# Patient Record
Sex: Female | Born: 1990 | Race: Black or African American | Hispanic: No | Marital: Single | Smoking: Never smoker
Health system: Southern US, Community
[De-identification: ages and names within clinical notes are randomized; demographics above are authoritative.]

## PROBLEM LIST (undated history)

## (undated) DIAGNOSIS — R110 Nausea: Secondary | ICD-10-CM

## (undated) DIAGNOSIS — B20 Human immunodeficiency virus [HIV] disease: Secondary | ICD-10-CM

## (undated) DIAGNOSIS — B181 Chronic viral hepatitis B without delta-agent: Secondary | ICD-10-CM

## (undated) HISTORY — DX: Chronic viral hepatitis B without delta-agent: B18.1

## (undated) HISTORY — PX: NO PAST SURGERIES: SHX2092

## (undated) HISTORY — DX: Nausea: R11.0

## (undated) HISTORY — DX: Human immunodeficiency virus (HIV) disease: B20

---

## 2014-11-06 ENCOUNTER — Other Ambulatory Visit: Payer: Managed Care, Other (non HMO)

## 2014-11-06 DIAGNOSIS — B20 Human immunodeficiency virus [HIV] disease: Secondary | ICD-10-CM

## 2014-11-06 DIAGNOSIS — Z79899 Other long term (current) drug therapy: Secondary | ICD-10-CM

## 2014-11-06 DIAGNOSIS — Z113 Encounter for screening for infections with a predominantly sexual mode of transmission: Secondary | ICD-10-CM

## 2014-11-06 LAB — COMPLETE METABOLIC PANEL WITH GFR
ALT: 87 U/L — AB (ref 6–29)
AST: 91 U/L — ABNORMAL HIGH (ref 10–30)
Albumin: 3 g/dL — ABNORMAL LOW (ref 3.6–5.1)
Alkaline Phosphatase: 52 U/L (ref 33–115)
BUN: 6 mg/dL — ABNORMAL LOW (ref 7–25)
CHLORIDE: 107 mmol/L (ref 98–110)
CO2: 24 mmol/L (ref 20–31)
CREATININE: 0.65 mg/dL (ref 0.50–1.10)
Calcium: 8.3 mg/dL — ABNORMAL LOW (ref 8.6–10.2)
GFR, Est Non African American: >89 mL/min (ref >=60)
Glucose, Bld: 86 mg/dL (ref 65–99)
POTASSIUM: 3.5 mmol/L (ref 3.5–5.3)
Sodium: 138 mmol/L (ref 135–146)
Total Bilirubin: 0.7 mg/dL (ref 0.2–1.2)
Total Protein: 7.5 g/dL (ref 6.1–8.1)

## 2014-11-06 LAB — CBC WITH DIFFERENTIAL/PLATELET
BASOS ABS: 0 10*3/uL (ref 0.0–0.1)
Basophils Relative: 0 % (ref 0–1)
EOS ABS: 0.1 10*3/uL (ref 0.0–0.7)
EOS PCT: 2 % (ref 0–5)
HCT: 36 % (ref 36.0–46.0)
Hemoglobin: 12.3 g/dL (ref 12.0–15.0)
LYMPHS ABS: 2.1 10*3/uL (ref 0.7–4.0)
Lymphocytes Relative: 47 % — ABNORMAL HIGH (ref 12–46)
MCH: 31.8 pg (ref 26.0–34.0)
MCHC: 34.2 g/dL (ref 30.0–36.0)
MCV: 93 fL (ref 78.0–100.0)
MONO ABS: 0.4 10*3/uL (ref 0.1–1.0)
MPV: 9.4 fL (ref 8.6–12.4)
Monocytes Relative: 9 % (ref 3–12)
Neutro Abs: 1.9 10*3/uL (ref 1.7–7.7)
Neutrophils Relative %: 42 % — ABNORMAL LOW (ref 43–77)
PLATELETS: 226 10*3/uL (ref 150–400)
RBC: 3.87 MIL/uL (ref 3.87–5.11)
RDW: 14 % (ref 11.5–15.5)
WBC: 4.5 10*3/uL (ref 4.0–10.5)

## 2014-11-06 LAB — LIPID PANEL
Cholesterol: 152 mg/dL (ref 125–200)
HDL: 56 mg/dL (ref >=46)
LDL CALC: 80 mg/dL (ref <130)
Total CHOL/HDL Ratio: 2.7 Ratio (ref <=5.0)
Triglycerides: 80 mg/dL (ref <150)
VLDL: 16 mg/dL (ref <30)

## 2014-11-07 LAB — HIV-1 RNA ULTRAQUANT REFLEX TO GENTYP+
HIV 1 RNA QUANT: 2193 {copies}/mL — AB (ref <20)
HIV-1 RNA Quant, Log: 3.34 Log copies/mL — ABNORMAL HIGH (ref <1.30)

## 2014-11-07 LAB — URINALYSIS
Bilirubin Urine: NEGATIVE
Glucose, UA: NEGATIVE
HGB URINE DIPSTICK: NEGATIVE
KETONES UR: NEGATIVE
LEUKOCYTES UA: NEGATIVE
NITRITE: NEGATIVE
PH: 6 (ref 5.0–8.0)
PROTEIN: NEGATIVE
Specific Gravity, Urine: 1.024 (ref 1.001–1.035)

## 2014-11-07 LAB — HEPATITIS B SURF AG CONFIRMATION: Hepatitis B Surf Ag Confirmation: POSITIVE — AB

## 2014-11-07 LAB — URINE CYTOLOGY ANCILLARY ONLY
Chlamydia: NEGATIVE
Neisseria Gonorrhea: NEGATIVE

## 2014-11-07 LAB — T-HELPER CELLS (CD4) COUNT (NOT AT ARMC)
CD4 % Helper T Cell: 31 % — ABNORMAL LOW (ref 33–55)
CD4 T CELL ABS: 660 /uL (ref 400–2700)

## 2014-11-07 LAB — HEPATITIS C ANTIBODY: HCV AB: NEGATIVE

## 2014-11-07 LAB — HEPATITIS B SURFACE ANTIBODY,QUALITATIVE: HEP B S AB: NEGATIVE

## 2014-11-07 LAB — RPR

## 2014-11-07 LAB — HEPATITIS B SURFACE ANTIGEN: HEP B S AG: POSITIVE — AB

## 2014-11-07 LAB — HEPATITIS B CORE ANTIBODY, TOTAL: HEP B C TOTAL AB: REACTIVE — AB

## 2014-11-07 LAB — HEPATITIS A ANTIBODY, TOTAL: HEP A TOTAL AB: REACTIVE — AB

## 2014-11-08 LAB — QUANTIFERON TB GOLD ASSAY (BLOOD)
Interferon Gamma Release Assay: NEGATIVE
Mitogen value: 1.02 [IU]/mL
Quantiferon Nil Value: 0.06 [IU]/mL
Quantiferon Tb Ag Minus Nil Value: -0.01 [IU]/mL
TB Ag value: 0.05 [IU]/mL

## 2014-11-12 LAB — HLA B*5701: HLA-B 5701 W/RFLX HLA-B HIGH: NEGATIVE

## 2014-11-17 LAB — HIV-1 GENOTYPR PLUS

## 2014-11-20 ENCOUNTER — Encounter: Payer: Self-pay | Admitting: *Deleted

## 2014-11-20 ENCOUNTER — Ambulatory Visit (INDEPENDENT_AMBULATORY_CARE_PROVIDER_SITE_OTHER): Payer: Managed Care, Other (non HMO) | Admitting: Internal Medicine

## 2014-11-20 ENCOUNTER — Encounter: Payer: Self-pay | Admitting: Internal Medicine

## 2014-11-20 VITALS — Temp 98.6°F | Ht 61.0 in | Wt 128.5 lb

## 2014-11-20 DIAGNOSIS — B20 Human immunodeficiency virus [HIV] disease: Secondary | ICD-10-CM

## 2014-11-20 DIAGNOSIS — R110 Nausea: Secondary | ICD-10-CM

## 2014-11-20 DIAGNOSIS — B181 Chronic viral hepatitis B without delta-agent: Secondary | ICD-10-CM | POA: Diagnosis not present

## 2014-11-20 DIAGNOSIS — Z23 Encounter for immunization: Secondary | ICD-10-CM

## 2014-11-20 MED ORDER — EMTRICITABINE-TENOFOVIR AF 200-25 MG PO TABS: 1.0000 | ORAL_TABLET | Freq: Every day | ORAL | Status: DC

## 2014-11-20 MED ORDER — DOLUTEGRAVIR SODIUM 50 MG PO TABS: 50.0000 mg | ORAL_TABLET | Freq: Every day | ORAL | Status: DC

## 2014-11-20 MED ORDER — ONDANSETRON HCL 4 MG PO TABS: 4.0000 mg | ORAL_TABLET | Freq: Three times a day (TID) | ORAL | Status: DC | PRN

## 2014-11-20 NOTE — Progress Notes (Signed)
Patient ID: Rolland BimlerAmber Rivers, female   DOB: 01-Jan-1991, 24 y.o.   MRN: [id number] HPI: Sarah Rivers is a 24 y.o. female who is here for her initial visit after being tx here.   Allergies: No Known Allergies  Vitals: Temp: 98.6 F (37 C) (11/10 1008) Temp Source: Oral (11/10 1008)  Past Medical History: No past medical history on file.  Social History: Social History   Social History  . Marital Status: Single    Spouse Name: N/A  . Number of Children: N/A  . Years of Education: N/A   Social History Main Topics  . Smoking status: Never Smoker   . Smokeless tobacco: Never Used  . Alcohol Use: No  . Drug Use: No  . Sexual Activity: No   Other Topics Concern  . None   Social History Narrative  . None    Previous Regimen: ATV/r/TRV, Complera  Current Regimen: Stribild  Labs: HIV 1 RNA QUANT (copies/mL)  Date Value  11/06/2014 2193*   CD4 T CELL ABS (/uL)  Date Value  11/06/2014 660   HEP B S AB (no units)  Date Value  11/06/2014 NEG   HEPATITIS B SURFACE AG (no units)  Date Value  11/06/2014 POSITIVE*   HCV AB (no units)  Date Value  11/06/2014 NEGATIVE    CrCl: Estimated Creatinine Clearance: 89.8 mL/min (by C-G formula based on Cr of 0.65).  Lipids:    Component Value Date/Time   CHOL 152 11/06/2014 1149   TRIG 80 11/06/2014 1149   HDL 56 11/06/2014 1149   CHOLHDL 2.7 11/06/2014 1149   VLDL 16 11/06/2014 1149   LDLCALC 80 11/06/2014 1149    Assessment: 24 yo who is here for the first time after being transferred for her HIV care here. She has been on several regimen in the past and they were changed due to mostly ADE like nausea. Her most current one is Stribild and she is facing the same thing. We are going to change her to DTG + Descovy since it probably has the lowest profile for ADEs. Going them over with her extensively and she is more than willing to go with this regimen. Kathie RhodesBetty sat her down and process the prescriptions [facility]. Her copay is $10 for Descovy. She is going to pick it up tomorrow.  Recommendations:  Dc Stribild Start Tivicay 50mg  PO qday Start Descovy 1 PO qday Start Zofran 4mg  PO q8prn  Clide CliffPham, Minh Quang, PharmD Clinical Infectious Disease Pharmacist [facility] 11/20/2014, 11:47 AM

## 2014-11-20 NOTE — Progress Notes (Signed)
Patient ID: Rolland BimlerAmber Vien, female   DOB: [birth year], 24 y.o.   MRN: [id number]       Patient ID: Joice LoftsAmber Schelling, female   DOB: [birth year], 24 y.o.   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 Her birth mother is deceased, she was adopted by her aunt/uncle. She was [facility] for the better part of her life at [facility], Dr. Tobias AlexanderWitley Williams.  Then in the last year, she sought care from another provider, in June 2015- he placed her on  stribild which she took up until this Sept.  Intermittently off/on of treatment this year. Last took ART since Sept. No meds in the last 2 months. Took stribild which made her have nausea, weakness, diarrhea. Even with taking with food and splitting   ART HX = Previously on  truvada-ATVr -> complera ( still some side effects but not as bad as stribild) -> then switched to stribid.   Originally [place], [place] [employer]. 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 Has [place] for 4-5 months, since June. Working at call center - 40hrs/ wk.  Soc hx: aunt and uncle adopted her at birth, live in [place]  No Known Allergies No current outpatient prescriptions on file prior to visit.   No current facility-administered medications on file prior to visit.   Active Ambulatory Problems    Diagnosis Date Noted  . HIV disease (HCC) 11/21/2014  . Chronic hepatitis B (HCC) 11/21/2014  . Chronic nausea 11/21/2014   Resolved Ambulatory Problems    Diagnosis Date Noted  . No Resolved Ambulatory Problems   No Additional Past Medical History   [facility]   Health Maintenance Due  Topic Date Due  . TETANUS/TDAP  05/04/2010  . PAP SMEAR  05/03/2012  . INFLUENZA VACCINE  08/11/2014    Social History  Substance Use Topics  . Smoking status: Never Smoker   . Smokeless tobacco: Never Used  . Alcohol Use: No    - not sexual active, started at 24yo.  Family Hx: DM2, and HTN  Review of Systems Review of Systems  Constitutional: Negative for fever, chills, diaphoresis, activity change, appetite change, fatigue and unexpected weight change.  HENT: Negative for congestion, sore throat, rhinorrhea, sneezing, trouble swallowing and sinus pressure.  Eyes: Negative for photophobia and visual disturbance.  Respiratory: Negative for cough, chest tightness, shortness of breath, wheezing and stridor.  Cardiovascular: Negative for chest pain, palpitations and leg swelling.  Gastrointestinal: Negative for nausea, vomiting, abdominal pain, diarrhea, constipation, blood in stool, abdominal distention and anal bleeding.  Genitourinary: Negative for dysuria, hematuria, flank pain and difficulty urinating.  Musculoskeletal: Negative for myalgias, back pain, joint swelling, arthralgias and gait problem.  Skin: Negative for color change, pallor, rash and wound.  Neurological: Negative for dizziness, tremors, weakness and light-headedness.  Hematological: Negative for adenopathy. Does not bruise/bleed easily.  Psychiatric/Behavioral: Negative for behavioral problems, confusion, sleep disturbance, dysphoric mood, decreased concentration and agitation.    Physical Exam   Temp(Src) 98.6 F (37 C) (Oral)  Ht 5\' 1"  (1.549 m)  Wt 128 lb 8 oz (58.287 kg)  BMI 24.29 kg/m2  LMP 11/03/2014  Physical Exam  Constitutional:  oriented to person, place, and time. appears well-developed and well-nourished. No distress.  HENT: [place]/AT, PERRLA, no scleral icterus Mouth/Throat: Oropharynx is clear and moist. No oropharyngeal exudate.  Cardiovascular: Normal rate, regular rhythm and normal heart sounds. Exam reveals no gallop and no friction rub.  No murmur heard.  Pulmonary/Chest: Effort normal and breath sounds normal. No respiratory distress.  has no wheezes.  Neck = supple, no nuchal rigidity Abdominal: Soft. Bowel sounds are  normal.  exhibits no distension. There is no tenderness.  Lymphadenopathy: no cervical adenopathy. No axillary adenopathy Neurological: alert and oriented to person, place, and time.  Skin: Skin is warm and dry. No rash noted. No erythema.  Psychiatric: a normal mood and affect.  behavior is normal.    Lab Results  Component Value Date   CD4TCELL 31* 11/06/2014   Lab Results  Component Value Date   CD4TABS 660 11/06/2014   Lab Results  Component Value Date   HIV1RNAQUANT 2193* 11/06/2014   Lab Results  Component Value Date   HEPBSAB NEG 11/06/2014   No results found for: RPR  CBC Lab Results  Component Value Date   WBC 4.5 11/06/2014   RBC 3.87 11/06/2014   HGB 12.3 11/06/2014   HCT 36.0 11/06/2014   PLT 226 11/06/2014   MCV 93.0 11/06/2014   MCH 31.8 11/06/2014   MCHC 34.2 11/06/2014   RDW 14.0 11/06/2014   LYMPHSABS 2.1 11/06/2014   MONOABS 0.4 11/06/2014   EOSABS 0.1 11/06/2014   BASOSABS 0.0 11/06/2014   BMET Lab Results  Component Value Date   NA 138 11/06/2014   K 3.5 11/06/2014   CL 107 11/06/2014   CO2 24 11/06/2014   GLUCOSE 86 11/06/2014   BUN 6* 11/06/2014   CREATININE 0.65 11/06/2014   CALCIUM 8.3* 11/06/2014   GFRNONAA >89 11/06/2014   GFRAA >89 11/06/2014     Assessment and Plan  hiv disease = will start her on tivicay and descovy. She will like the pill size being small. We will check that she does not have integrase resistance  Chronic hep b = will check hep B viral load, descovy will provide adequate tx for HBV  Health maintenance = get pap arranged through the clinic since it has been 2 years since last exam. She defered further immunizations  Chronic nausea = will give prn zofran.

## 2014-11-21 ENCOUNTER — Encounter: Payer: Self-pay | Admitting: Internal Medicine

## 2014-11-21 DIAGNOSIS — B20 Human immunodeficiency virus [HIV] disease: Secondary | ICD-10-CM | POA: Insufficient documentation

## 2014-11-21 DIAGNOSIS — R110 Nausea: Secondary | ICD-10-CM | POA: Insufficient documentation

## 2014-11-21 DIAGNOSIS — B181 Chronic viral hepatitis B without delta-agent: Secondary | ICD-10-CM | POA: Insufficient documentation

## 2014-11-25 LAB — HIV-1 RNA QUANT-NO REFLEX-BLD
HIV 1 RNA Quant: 458 copies/mL — ABNORMAL HIGH (ref <20)
HIV-1 RNA QUANT, LOG: 2.66 {Log_copies}/mL — AB (ref <1.30)

## 2014-11-27 LAB — HEPATITIS B DNA, ULTRAQUANTITATIVE, PCR

## 2014-11-28 LAB — HIV-1 INTEGRASE GENOTYPE

## 2015-01-13 ENCOUNTER — Ambulatory Visit: Payer: Managed Care, Other (non HMO) | Admitting: Internal Medicine

## 2015-01-23 ENCOUNTER — Telehealth: Payer: Self-pay | Admitting: Pharmacy Technician

## 2015-01-28 MED FILL — TIVICAY 50 MG TABLET: 50 | 30 days supply | Qty: 30 | Fill #1

## 2015-01-28 MED FILL — *DESCOVY 200-25 MG TABLET: 200-25 | 30 days supply | Qty: 30 | Fill #1

## 2015-02-12 ENCOUNTER — Encounter (HOSPITAL_COMMUNITY): Payer: Self-pay | Admitting: *Deleted

## 2015-02-12 ENCOUNTER — Inpatient Hospital Stay (HOSPITAL_COMMUNITY)
Admission: AD | Admit: 2015-02-12 | Discharge: 2015-02-12 | Disposition: A | Discharge status: Home / Self Care | Payer: Managed Care, Other (non HMO) | Source: Ambulatory Visit | Attending: Obstetrics & Gynecology | Admitting: Obstetrics & Gynecology

## 2015-02-12 DIAGNOSIS — R109 Unspecified abdominal pain: Secondary | ICD-10-CM | POA: Diagnosis not present

## 2015-02-12 DIAGNOSIS — N939 Abnormal uterine and vaginal bleeding, unspecified: Secondary | ICD-10-CM | POA: Diagnosis present

## 2015-02-12 DIAGNOSIS — N390 Urinary tract infection, site not specified: Secondary | ICD-10-CM

## 2015-02-12 DIAGNOSIS — B20 Human immunodeficiency virus [HIV] disease: Secondary | ICD-10-CM | POA: Insufficient documentation

## 2015-02-12 LAB — URINALYSIS, ROUTINE W REFLEX MICROSCOPIC
Bilirubin Urine: NEGATIVE
GLUCOSE, UA: NEGATIVE mg/dL
Hgb urine dipstick: NEGATIVE
KETONES UR: 15 mg/dL — AB
Nitrite: NEGATIVE
PROTEIN: NEGATIVE mg/dL
Specific Gravity, Urine: 1.025 (ref 1.005–1.030)
pH: 6 (ref 5.0–8.0)

## 2015-02-12 LAB — URINE MICROSCOPIC-ADD ON: RBC / HPF: NONE SEEN RBC/hpf (ref 0–5)

## 2015-02-12 LAB — POCT PREGNANCY, URINE: Preg Test, Ur: NEGATIVE

## 2015-02-12 MED ORDER — SULFAMETHOXAZOLE-TRIMETHOPRIM 800-160 MG PO TABS: 1.0000 | ORAL_TABLET | Freq: Two times a day (BID) | ORAL | Status: DC

## 2015-02-12 MED FILL — SULFAMETHOXAZOLE/TMP DS TAB: 800-160 | 3 days supply | Qty: 6 | Fill #0

## 2015-02-12 NOTE — MAU Note (Signed)
Pt states she came in today because she started her cycle on 01/22 and the bleeding is just now stopping. Also reports a lot of cramping.

## 2015-02-12 NOTE — MAU Note (Addendum)
Period started 22nd of January and lasted til yesterday with severe cramping; usually pt's period last 3-4 days with light cramping; is not currently on [facility];

## 2015-02-12 NOTE — Discharge Instructions (Signed)

## 2015-02-12 NOTE — MAU Provider Note (Signed)
History     CSN: [id number]  Arrival date and time: 02/12/15 7829   First Provider Initiated Contact with Patient 02/12/15 0840      Chief Complaint  Patient presents with  . Vaginal Bleeding   HPI   Ms.Sarah Rivers is a 25 y.o. female G1P0010 with a history of HIV and Hep B presents to MAU with concerns regarding her last menstrual cycle. She normally has regular periods that are not painful. This last period started on 12/22 and ended on 2/1- this was the normal time for her period. She feels that this cycle was "weird".  The flow was different and changed throughout the cycle; altering from heavy to light. She also had more pain with this cycle than normal. She continues to have pain in both sids of her abdomen. The pain is cramp like and she currently rates the pain 7/10. She has not taken anything for the pain.  She was eager to know if her pregnancy test was negative.   She is currently sexually active. She always uses a condom with intercourse.  She declines a pelvic exam and STI testing. Says she would like to wait to do this when she see's her GYN for annual.   OB History    Gravida Para Term Preterm AB TAB SAB Ectopic Multiple Living   Past Medical History  Diagnosis Date  . HIV disease (HCC)   . Chronic hepatitis B (HCC)   . Chronic nausea     Past Surgical History  Procedure Laterality Date  . No past surgeries      Family History  Problem Relation Age of Onset  . Hypertension Mother   . Diabetes Mother     Social History  Substance Use Topics  . Smoking status: Never Smoker   . Smokeless tobacco: Never Used  . Alcohol Use: No    Allergies: No Known Allergies  Prescriptions prior to admission  Medication Sig Dispense Refill Last Dose  . dolutegravir (TIVICAY) 50 MG tablet Take 1 tablet (50 mg total) by mouth daily. 30 tablet 11 02/11/2015 at Unknown time  . emtricitabine-tenofovir AF (DESCOVY) 200-25 MG tablet Take 1 tablet by  mouth daily. 30 tablet 11 02/11/2015 at Unknown time  . ondansetron (ZOFRAN) 4 MG tablet Take 1 tablet (4 mg total) by mouth every 8 (eight) hours as needed for nausea or vomiting. (Patient not taking: Reported on 02/12/2015) 30 tablet 11    Results for orders placed or performed during the hospital encounter of 02/12/15 (from the past 48 hour(s))  Urinalysis, Routine w reflex microscopic (not at [facility])     Status: Abnormal   Collection Time: 02/12/15  6:55 AM  Result Value Ref Range   Color, Urine YELLOW YELLOW   APPearance HAZY (A) CLEAR   Specific Gravity, Urine 1.025 1.005 - 1.030   pH 6.0 5.0 - 8.0   Glucose, UA NEGATIVE NEGATIVE mg/dL   Hgb urine dipstick NEGATIVE NEGATIVE   Bilirubin Urine NEGATIVE NEGATIVE   Ketones, ur 15 (A) NEGATIVE mg/dL   Protein, ur NEGATIVE NEGATIVE mg/dL   Nitrite NEGATIVE NEGATIVE   Leukocytes, UA MODERATE (A) NEGATIVE  Urine microscopic-add on     Status: Abnormal   Collection Time: 02/12/15  6:55 AM  Result Value Ref Range   Squamous Epithelial / LPF 0-5 (A) NONE SEEN   WBC, UA 6-30 0 - 5 WBC/hpf   RBC /  HPF NONE SEEN 0 - 5 RBC/hpf   Bacteria, UA FEW (A) NONE SEEN   Urine-Other MUCOUS PRESENT   Pregnancy, urine POC     Status: None   Collection Time: 02/12/15  7:45 AM  Result Value Ref Range   Preg Test, Ur NEGATIVE NEGATIVE    Comment:        THE SENSITIVITY OF THIS METHODOLOGY IS >24 mIU/mL     Review of Systems  Constitutional: Negative for fever and chills.  Gastrointestinal: Positive for abdominal pain.  Genitourinary: Positive for dysuria. Negative for urgency and frequency.   Physical Exam   Blood pressure 119/73, pulse 62, temperature 98.8 F (37.1 C), temperature source Oral, resp. rate 16, last menstrual period 02/01/2015, SpO2 100 %.  Physical Exam  Constitutional: She is oriented to person, place, and time. She appears well-developed and well-nourished. No distress.  HENT:  Head: Normocephalic.  Eyes: Pupils are equal,  round, and reactive to light.  Respiratory: Effort normal.  GI: Soft. Normal appearance. There is tenderness in the suprapubic area. There is no rigidity, no rebound, no guarding and no CVA tenderness.  Musculoskeletal: Normal range of motion.  Neurological: She is alert and oriented to person, place, and time.  Skin: Skin is warm. She is not diaphoretic.  Psychiatric: Her behavior is normal.    MAU Course  Procedures  None   MDM She declines the need for pain medication at this time.   Assessment and Plan    A:  1. Abdominal cramping   2. UTI (urinary tract infection), uncomplicated     P:  Discharge home in stable condition RX: Bactrim Follow up with GYN for follow up Return to MAU if symptoms worsen.     Duane Lope, NP 02/12/2015 2:46 PM

## 2015-02-14 ENCOUNTER — Emergency Department (HOSPITAL_COMMUNITY): Payer: Managed Care, Other (non HMO)

## 2015-02-14 ENCOUNTER — Emergency Department (HOSPITAL_COMMUNITY)
Admission: EM | Admit: 2015-02-14 | Discharge: 2015-02-14 | Disposition: A | Discharge status: Home / Self Care | Payer: Managed Care, Other (non HMO) | Attending: Emergency Medicine | Admitting: Emergency Medicine

## 2015-02-14 ENCOUNTER — Encounter (HOSPITAL_COMMUNITY): Payer: Self-pay | Admitting: Emergency Medicine

## 2015-02-14 DIAGNOSIS — R300 Dysuria: Secondary | ICD-10-CM

## 2015-02-14 DIAGNOSIS — R109 Unspecified abdominal pain: Secondary | ICD-10-CM

## 2015-02-14 DIAGNOSIS — N739 Female pelvic inflammatory disease, unspecified: Secondary | ICD-10-CM | POA: Diagnosis not present

## 2015-02-14 DIAGNOSIS — R1030 Lower abdominal pain, unspecified: Secondary | ICD-10-CM

## 2015-02-14 DIAGNOSIS — N921 Excessive and frequent menstruation with irregular cycle: Secondary | ICD-10-CM | POA: Insufficient documentation

## 2015-02-14 DIAGNOSIS — R112 Nausea with vomiting, unspecified: Secondary | ICD-10-CM | POA: Diagnosis not present

## 2015-02-14 DIAGNOSIS — B20 Human immunodeficiency virus [HIV] disease: Secondary | ICD-10-CM | POA: Insufficient documentation

## 2015-02-14 DIAGNOSIS — N730 Acute parametritis and pelvic cellulitis: Secondary | ICD-10-CM

## 2015-02-14 DIAGNOSIS — Z79899 Other long term (current) drug therapy: Secondary | ICD-10-CM | POA: Insufficient documentation

## 2015-02-14 DIAGNOSIS — N760 Acute vaginitis: Secondary | ICD-10-CM | POA: Diagnosis not present

## 2015-02-14 DIAGNOSIS — R740 Nonspecific elevation of levels of transaminase and lactic acid dehydrogenase [LDH]: Secondary | ICD-10-CM | POA: Diagnosis not present

## 2015-02-14 DIAGNOSIS — R102 Pelvic and perineal pain: Secondary | ICD-10-CM

## 2015-02-14 DIAGNOSIS — N898 Other specified noninflammatory disorders of vagina: Secondary | ICD-10-CM

## 2015-02-14 DIAGNOSIS — Z792 Long term (current) use of antibiotics: Secondary | ICD-10-CM | POA: Diagnosis not present

## 2015-02-14 DIAGNOSIS — N949 Unspecified condition associated with female genital organs and menstrual cycle: Secondary | ICD-10-CM

## 2015-02-14 DIAGNOSIS — R7401 Elevation of levels of liver transaminase levels: Secondary | ICD-10-CM

## 2015-02-14 DIAGNOSIS — B9689 Other specified bacterial agents as the cause of diseases classified elsewhere: Secondary | ICD-10-CM

## 2015-02-14 LAB — URINALYSIS, ROUTINE W REFLEX MICROSCOPIC
BILIRUBIN URINE: NEGATIVE
GLUCOSE, UA: NEGATIVE mg/dL
Hgb urine dipstick: NEGATIVE
KETONES UR: NEGATIVE mg/dL
NITRITE: NEGATIVE
PH: 6 (ref 5.0–8.0)
Protein, ur: NEGATIVE mg/dL
Specific Gravity, Urine: 1.007 (ref 1.005–1.030)

## 2015-02-14 LAB — WET PREP, GENITAL
Sperm: NONE SEEN
Trich, Wet Prep: NONE SEEN
Yeast Wet Prep HPF POC: NONE SEEN

## 2015-02-14 LAB — DIFFERENTIAL
BASOS ABS: 0 10*3/uL (ref 0.0–0.1)
Basophils Relative: 0 %
Eosinophils Absolute: 0.1 10*3/uL (ref 0.0–0.7)
Eosinophils Relative: 1 %
LYMPHS ABS: 2.4 10*3/uL (ref 0.7–4.0)
LYMPHS PCT: 29 %
Monocytes Absolute: 0.6 10*3/uL (ref 0.1–1.0)
Monocytes Relative: 7 %
NEUTROS PCT: 63 %
Neutro Abs: 5.1 10*3/uL (ref 1.7–7.7)

## 2015-02-14 LAB — COMPREHENSIVE METABOLIC PANEL
ALBUMIN: 3.3 g/dL — AB (ref 3.5–5.0)
ALK PHOS: 57 U/L (ref 38–126)
ALT: 69 U/L — AB (ref 14–54)
AST: 97 U/L — AB (ref 15–41)
Anion gap: 11 (ref 5–15)
BILIRUBIN TOTAL: 1 mg/dL (ref 0.3–1.2)
BUN: 7 mg/dL (ref 6–20)
CO2: 21 mmol/L — ABNORMAL LOW (ref 22–32)
CREATININE: 0.7 mg/dL (ref 0.44–1.00)
Calcium: 9.2 mg/dL (ref 8.9–10.3)
Chloride: 107 mmol/L (ref 101–111)
GFR calc Af Amer: >60 mL/min (ref >60)
GFR calc non Af Amer: >60 mL/min (ref >60)
GLUCOSE: 82 mg/dL (ref 65–99)
POTASSIUM: 4.4 mmol/L (ref 3.5–5.1)
Sodium: 139 mmol/L (ref 135–145)
TOTAL PROTEIN: 8 g/dL (ref 6.5–8.1)

## 2015-02-14 LAB — CBC
HEMATOCRIT: 36.8 % (ref 36.0–46.0)
Hemoglobin: 12.5 g/dL (ref 12.0–15.0)
MCH: 31.8 pg (ref 26.0–34.0)
MCHC: 34 g/dL (ref 30.0–36.0)
MCV: 93.6 fL (ref 78.0–100.0)
PLATELETS: 202 10*3/uL (ref 150–400)
RBC: 3.93 MIL/uL (ref 3.87–5.11)
RDW: 12.9 % (ref 11.5–15.5)
WBC: 8.2 10*3/uL (ref 4.0–10.5)

## 2015-02-14 LAB — URINE MICROSCOPIC-ADD ON
BACTERIA UA: NONE SEEN
RBC / HPF: NONE SEEN RBC/hpf (ref 0–5)

## 2015-02-14 LAB — LIPASE, BLOOD: Lipase: 46 U/L (ref 11–51)

## 2015-02-14 MED ORDER — CEFTRIAXONE SODIUM 250 MG IJ SOLR
250.0000 mg | Freq: Once | INTRAMUSCULAR | Status: AC
  Administered 2015-02-14: 250 mg via INTRAMUSCULAR
  Filled 2015-02-14: qty 250

## 2015-02-14 MED ORDER — ONDANSETRON 4 MG PO TBDP
8.0000 mg | ORAL_TABLET | Freq: Once | ORAL | Status: AC
  Administered 2015-02-14: 8 mg via ORAL
  Filled 2015-02-14: qty 2

## 2015-02-14 MED ORDER — AZITHROMYCIN 250 MG PO TABS
1000.0000 mg | ORAL_TABLET | Freq: Once | ORAL | Status: AC
  Administered 2015-02-14: 1000 mg via ORAL
  Filled 2015-02-14: qty 4

## 2015-02-14 MED ORDER — DOXYCYCLINE HYCLATE 100 MG PO CAPS: 100.0000 mg | ORAL_CAPSULE | Freq: Two times a day (BID) | ORAL | Status: DC

## 2015-02-14 MED ORDER — METRONIDAZOLE 500 MG PO TABS: 500.0000 mg | ORAL_TABLET | Freq: Two times a day (BID) | ORAL | Status: DC

## 2015-02-14 MED ORDER — LIDOCAINE HCL (PF) 1 % IJ SOLN: 1.0000 mL | Freq: Once | INTRAMUSCULAR | Status: DC

## 2015-02-14 MED ORDER — ONDANSETRON 4 MG PO TBDP: 4.0000 mg | ORAL_TABLET | Freq: Three times a day (TID) | ORAL | Status: DC | PRN

## 2015-02-14 MED ORDER — LIDOCAINE HCL (PF) 1 % IJ SOLN
INTRAMUSCULAR | Status: AC
  Administered 2015-02-14: 1 mL via INTRAMUSCULAR
  Filled 2015-02-14: qty 5

## 2015-02-14 NOTE — Discharge Instructions (Signed)
You have been treated for gonorrhea and chlamydia in the ER but the hospital will call you if lab is positive. You were tested for Syphilis, and the hospital will call you if the lab is positive. Do not engage in sexual intercourse until you find out about your results. Given your vaginal discharge and pelvic pain, you are being treated for pelvic inflammatory disease, take doxycycline as directed and avoid sun exposure while taking this medication. Your vaginal swab also showed bacterial vaginosis, take flagyl as directed, and avoid alcohol use while taking this medication.  Use tylenol or motrin as needed for abdominal pain. Continue taking your bactrim for your urinary tract infection. Use zofran as needed for nausea/vomiting. Stay well hydrated. Follow up with your regular doctor or OBGYN in 1 week for recheck of ongoing symptoms. Return to the ER for changes or worsening symptoms   Abdominal Pain, Adult Many things can cause belly (abdominal) pain. Most times, the belly pain is not dangerous. Many cases of belly pain can be watched and treated at home. HOME CARE   Do not take medicines that help you go poop (laxatives) unless told to by your doctor.  Only take medicine as told by your doctor.  Eat or drink as told by your doctor. Your doctor will tell you if you should be on a special diet. GET HELP IF:  You do not know what is causing your belly pain.  You have belly pain while you are sick to your stomach (nauseous) or have runny poop (diarrhea).  You have pain while you pee or poop.  Your belly pain wakes you up at night.  You have belly pain that gets worse or better when you eat.  You have belly pain that gets worse when you eat fatty foods.  You have a fever. GET HELP RIGHT AWAY IF:   The pain does not go away within 2 hours.  You keep throwing up (vomiting).  The pain changes and is only in the right or left part of the belly.  You have bloody or tarry looking  poop. MAKE SURE YOU:   Understand these instructions.  Will watch your condition.  Will get help right away if you are not doing well or get worse.   This information is not intended to replace advice given to you by your health care provider. Make sure you discuss any questions you have with your health care provider.   Document Released: 06/15/2007 Document Revised: 01/17/2014 Document Reviewed: 09/05/2012 Elsevier Interactive Patient Education 2016 [employer]  Bacterial Vaginosis Bacterial vaginosis is an infection of the vagina. It happens when too many germs (bacteria) grow in the vagina. Having this infection puts you at risk for getting other infections from sex. Treating this infection can help lower your risk for other infections, such as:   Chlamydia.  Gonorrhea.  HIV.  Herpes. HOME CARE  Take your medicine as told by your doctor.  Finish your medicine even if you start to feel better.  Tell your sex partner that you have an infection. They should see their doctor for treatment.  During treatment:  Avoid sex or use condoms correctly.  Do not douche.  Do not drink alcohol unless your doctor tells you it is ok.  Do not breastfeed unless your doctor tells you it is ok. GET HELP IF:  You are not getting better after 3 days of treatment.  You have more grey fluid (discharge) coming from your vagina than before.  You have more pain than before.  You have a fever. MAKE SURE YOU:   Understand these instructions.  Will watch your condition.  Will get help right away if you are not doing well or get worse.   This information is not intended to replace advice given to you by your health care provider. Make sure you discuss any questions you have with your health care provider.   Document Released: 10/06/2007 Document Revised: 01/17/2014 Document Reviewed: 08/08/2012 Elsevier Interactive Patient Education 2016 [employer]  Pelvic Inflammatory  Disease Pelvic inflammatory disease (PID) refers to an infection in some or all of the female organs. The infection can be in the uterus, ovaries, fallopian tubes, or the surrounding tissues in the pelvis. PID can cause abdominal or pelvic pain that comes on suddenly (acute pelvic pain). PID is a serious infection because it can lead to lasting (chronic) pelvic pain or the inability to have children (infertility). CAUSES This condition is most often caused by an infection that is spread during sexual contact. However, the infection can also be caused by the normal bacteria that are found in the vaginal tissues if these bacteria travel upward into the reproductive organs. PID can also occur following:  The birth of a baby.  A miscarriage.  An abortion.  Major pelvic surgery.  The use of an intrauterine device (IUD).  A sexual assault. RISK FACTORS This condition is more likely to develop in women who:  Are younger than 24 years of age.  Are sexually active at [facility] age.  Use nonbarrier contraception.  Have multiple sexual partners.  Have sex with someone who has symptoms of an STD (sexually transmitted disease).  Use oral contraception. At times, certain behaviors can also increase the possibility of getting PID, such as:  Using a vaginal douche.  Having an IUD in place. SYMPTOMS Symptoms of this condition include:  Abdominal or pelvic pain.  Fever.  Chills.  Abnormal vaginal discharge.  Abnormal uterine bleeding.  Unusual pain shortly after the end of a menstrual period.  Painful urination.  Pain with sexual intercourse.  Nausea and vomiting. DIAGNOSIS To diagnose this condition, your health care provider will do a physical exam and take your medical history. A pelvic exam typically reveals great tenderness in the uterus and the surrounding pelvic tissues. You may also have tests, such as:  Lab tests, including a pregnancy test, blood tests, and urine  test.  Culture tests of the vagina and cervix to check for an STD.  Ultrasound.  A laparoscopic procedure to look inside the pelvis.  Examining vaginal secretions under a microscope. TREATMENT Treatment for this condition may involve one or more approaches.  Antibiotic medicines may be prescribed to be taken by mouth.  Sexual partners may need to be treated if the infection is caused by an STD.  For more severe cases, hospitalization may be needed to give antibiotics directly into a vein through an IV tube.  Surgery may be needed if other treatments do not help, but this is rare. It may take weeks until you are completely well. If you are diagnosed with PID, you should also be checked for human immunodeficiency virus (HIV). Your health care provider may test you for infection again 3 months after treatment. You should not have unprotected sex. HOME CARE INSTRUCTIONS  Take over-the-counter and prescription medicines only as told by your health care provider.  If you were prescribed an antibiotic medicine, take it as told by your health care provider. Do  not stop taking the antibiotic even if you start to feel better.  Do not have sexual intercourse until treatment is completed or as told by your health care provider. If PID is confirmed, your recent sexual partners will need treatment, especially if you had unprotected sex.  Keep all follow-up visits as told by your health care provider. This is important. SEEK MEDICAL CARE IF:  You have increased or abnormal vaginal discharge.  Your pain does not improve.  You vomit.  You have a fever.  You cannot tolerate your medicines.  Your partner has an STD.  You have pain when you urinate. SEEK IMMEDIATE MEDICAL CARE IF:  You have increased abdominal or pelvic pain.  You have chills.  Your symptoms are not better in 72 hours even with treatment.   This information is not intended to replace advice given to you by your health  care provider. Make sure you discuss any questions you have with your health care provider.   Document Released: 12/27/2004 Document Revised: 09/17/2014 Document Reviewed: 02/03/2014 Elsevier Interactive Patient Education 2016 [employer] Inc[employer]  Pelvic Pain, Female Pelvic pain is pain felt below the belly button and between your hips. It can be caused by many different things. It is important to get help right away. This is especially true for severe, sharp, or unusual pain that comes on suddenly.  HOME CARE  Only take medicine as told by your doctor.  Rest as told by your doctor.  Eat a healthy diet, such as fruits, vegetables, and lean meats.  Drink enough fluids to keep your pee (urine) clear or pale yellow, or as told.  Avoid sex (intercourse) if it causes pain.  Apply warm or cold packs to your lower belly (abdomen). Use the type of pack that helps the pain.  Avoid situations that cause you stress.  Keep a journal to track your pain. Write down:  When the pain started.  Where it is located.  If there are things that seem to be related to the pain, such as food or your period.  Follow up with your doctor as told. GET HELP RIGHT AWAY IF:   You have heavy bleeding from the vagina.  You have more pelvic pain.  You feel lightheaded or pass out (faint).  You have chills.  You have pain when you pee or have blood in your pee.  You cannot stop having watery poop (diarrhea).  You cannot stop throwing up (vomiting).  You have a fever or lasting symptoms for more than 3 days.  You have a fever and your symptoms suddenly get worse.  You are being physically or sexually abused.  Your medicine does not help your pain.  You have fluid (discharge) coming from your vagina that is not normal. MAKE SURE YOU:  Understand these instructions.  Will watch your condition.  Will get help if you are not doing well or get worse.   This information is not intended to replace  advice given to you by your health care provider. Make sure you discuss any questions you have with your health care provider.   Document Released: 06/15/2007 Document Revised: 01/17/2014 Document Reviewed: 04/18/2011 Elsevier Interactive Patient Education 2016 [employer].   Sexually Transmitted Disease A sexually transmitted disease (STD) is a disease or infection often passed to another person during sex. However, STDs can be passed through nonsexual ways. An STD can be passed through:  Spit (saliva).  Semen.  Blood.  Mucus from the vagina.  Pee (  urine). HOW CAN I LESSEN MY CHANCES OF GETTING AN STD?  Use:  Latex condoms.  Water-soluble lubricants with condoms. Do not use petroleum jelly or oils.  Dental dams. These are small pieces of latex that are used as a barrier during oral sex.  Avoid having more than one sex partner.  Do not have sex with someone who has other sex partners.  Do not have sex with anyone you do not know or who is at high risk for an STD.  Avoid risky sex that can break your skin.  Do not have sex if you have open sores on your mouth or skin.  Avoid drinking too much alcohol or taking illegal drugs. Alcohol and drugs can affect your good judgment.  Avoid oral and anal sex acts.  Get shots (vaccines) for HPV and hepatitis.  If you are at risk of being infected with HIV, it is advised that you take a certain medicine daily to prevent HIV infection. This is called pre-exposure prophylaxis (PrEP). You may be at risk if:  You are a man who has sex with other men (MSM).  You are attracted to the opposite sex (heterosexual) and are having sex with more than one partner.  You take drugs with a needle.  You have sex with someone who has HIV.  Talk with your doctor about if you are at high risk of being infected with HIV. If you begin to take PrEP, get tested for HIV first. Get tested every 3 months for as long as you are taking PrEP.  Get  tested for STDs every year if you are sexually active. If you are treated for an STD, get tested again 3 months after you are treated. WHAT SHOULD I DO IF I THINK I HAVE AN STD?  See your doctor.  Tell your sex partner(s) that you have an STD. They should be tested and treated.  Do not have sex until your doctor says it is okay. WHEN SHOULD I GET HELP? Get help right away if:  You have bad belly (abdominal) pain.  You are a man and have puffiness (swelling) or pain in your testicles.  You are a woman and have puffiness in your vagina.   This information is not intended to replace advice given to you by your health care provider. Make sure you discuss any questions you have with your health care provider.   Document Released: 02/04/2004 Document Revised: 01/17/2014 Document Reviewed: 06/22/2012 Elsevier Interactive Patient Education 2016 [employer] Inc.  Nausea and Vomiting Nausea means you feel sick to your stomach. Throwing up (vomiting) is a reflex where stomach contents come out of your mouth. HOME CARE   Take medicine as told by your doctor.  Do not force yourself to eat. However, you do need to drink fluids.  If you feel like eating, eat a normal diet as told by your doctor.  Eat rice, wheat, potatoes, bread, lean meats, yogurt, fruits, and vegetables.  Avoid high-fat foods.  Drink enough fluids to keep your pee (urine) clear or pale yellow.  Ask your doctor how to replace body fluid losses (rehydrate). Signs of body fluid loss (dehydration) include:  Feeling very thirsty.  Dry lips and mouth.  Feeling dizzy.  Dark pee.  Peeing less than normal.  Feeling confused.  Fast breathing or heart rate. GET HELP RIGHT AWAY IF:   You have blood in your throw up.  You have black or bloody poop (stool).  You have a bad headache  or stiff neck.  You feel confused.  You have bad belly (abdominal) pain.  You have chest pain or trouble breathing.  You do not pee  at least once every 8 hours.  You have cold, clammy skin.  You keep throwing up after 24 to 48 hours.  You have a fever. MAKE SURE YOU:   Understand these instructions.  Will watch your condition.  Will get help right away if you are not doing well or get worse.   This information is not intended to replace advice given to you by your health care provider. Make sure you discuss any questions you have with your health care provider.   Document Released: 06/15/2007 Document Revised: 03/21/2011 Document Reviewed: 05/28/2010 Elsevier Interactive Patient Education [employer]

## 2015-02-14 NOTE — ED Notes (Signed)
Pt verbalizes understanding of instructions. 

## 2015-02-14 NOTE — ED Notes (Signed)
Pt c/o nausea and vomits small amount.Street pa aware.

## 2015-02-14 NOTE — ED Notes (Signed)
Pt oob to br with steady gait 

## 2015-02-14 NOTE — ED Notes (Signed)
Iv attempted x 1 with swelling. Labs drawn and pt request we wait until need for IV before restick. A at bedside.

## 2015-02-14 NOTE — ED Provider Notes (Signed)
CSN: [id number]     Arrival date & time 02/14/15  1141 History   First MD Initiated Contact with Patient 02/14/15 1220     Chief Complaint  Patient presents with  . Abdominal Pain  . Urinary Tract Infection     (Consider location/radiation/quality/duration/timing/severity/associated sxs/prior Treatment) HPI Comments: Sarah Rivers is a 25 y.o. female with a PMHx of HIV, hepatitis B, and chronic nausea, who presents to the ED with complaints of ongoing lower abdominal pain 2 weeks. Patient was seen at [facility] on 02/12/15 due to having a prolonged menstrual cycle from 1/22-2/1, had some mild pubic pain at that time but refused a pelvic exam stating that she would see her OB/GYN later for 1. Denied any STI testing at that time. She was found to have a UTI and was started on Bactrim. Patient states that since then she has had continuation of her abdominal pain which she describes as 10/10 intermittent cramping across the lower abdomen although right greater than left, radiating into the right flank, worse with movement, with no treatment tried prior to arrival. Associated symptoms include dysuria as well as white nonodorous thin vaginal discharge. She states that her menstrual cycle did finish on 2/1 and she has had no ongoing vaginal bleeding since then. She is amendable to STI testing and pelvic exam today. She admits to being sexually active with one female partner, protected with condoms. She is followed by Dr. Ilsa Iha of infectious disease, last visit was in December, last CD4 count was 660 on 11/06/14. She is compliant with her HIV medications, as well as her bactrim antibiotic.  She denies any fevers, chills, chest pain, shortness breath, nausea, vomiting, diarrhea, constipation, obstipation, melena, hematochezia, hematuria, urinary frequency or urgency, vaginal bleeding, vaginal itching, genital sores, numbness, tingling, weakness, recent travel, sick contacts, suspicious food intake, alcohol  use, or chronic NSAID use.  Patient is a 25 y.o. female presenting with abdominal pain and urinary tract infection. The history is provided by the patient and medical records. No language interpreter was used.  Abdominal Pain Pain location:  RLQ, LLQ and suprapubic Pain quality: cramping   Pain radiates to:  R flank Pain severity:  Severe Onset quality:  Gradual Duration:  2 weeks Timing:  Intermittent Progression:  Waxing and waning Chronicity:  New Context: not recent travel, not sick contacts and not suspicious food intake   Relieved by:  None tried Worsened by:  Movement Ineffective treatments:  None tried Associated symptoms: dysuria and vaginal discharge   Associated symptoms: no chest pain, no chills, no constipation, no diarrhea, no fever, no flatus, no hematemesis, no hematochezia, no hematuria, no melena, no nausea, no shortness of breath, no vaginal bleeding and no vomiting   Risk factors: no alcohol abuse and no NSAID use   Urinary Tract Infection Associated symptoms: abdominal pain and vaginal discharge   Associated symptoms: no fever, no nausea and no vomiting     Past Medical History  Diagnosis Date  . HIV disease (HCC)   . Chronic hepatitis B (HCC)   . Chronic nausea    Past Surgical History  Procedure Laterality Date  . No past surgeries     Family History  Problem Relation Age of Onset  . Hypertension Mother   . Diabetes Mother    Social History  Substance Use Topics  . Smoking status: Never Smoker   . Smokeless tobacco: Never Used  . Alcohol Use: No   OB History    Gravida Para Term  Preterm AB TAB SAB Ectopic Multiple Living   1    1  1         Review of Systems  Constitutional: Negative for fever and chills.  Respiratory: Negative for shortness of breath.   Cardiovascular: Negative for chest pain.  Gastrointestinal: Positive for abdominal pain. Negative for nausea, vomiting, diarrhea, constipation, blood in stool, melena, hematochezia, flatus  and hematemesis.  Genitourinary: Positive for dysuria, vaginal discharge and menstrual problem (prolonged from 1/22-2/1). Negative for frequency, hematuria, vaginal bleeding, genital sores and vaginal pain.  Musculoskeletal: Negative for myalgias and arthralgias.  Skin: Negative for color change.  Allergic/Immunologic: Positive for immunocompromised state (HIV+).  Neurological: Negative for weakness and numbness.  Psychiatric/Behavioral: Negative for confusion.   10 Systems reviewed and are negative for acute change except as noted in the HPI.    Allergies  Review of patient's allergies indicates no known allergies.  Home Medications   Prior to Admission medications   Medication Sig Start Date End Date Taking? Authorizing Provider  dolutegravir (TIVICAY) 50 MG tablet Take 1 tablet (50 mg total) by mouth daily. 11/20/14   Judyann Munson, MD  emtricitabine-tenofovir AF (DESCOVY) 200-25 MG tablet Take 1 tablet by mouth daily. 11/20/14   Judyann Munson, MD  sulfamethoxazole-trimethoprim (BACTRIM DS,SEPTRA DS) 800-160 MG tablet Take 1 tablet by mouth 2 (two) times daily. 02/12/15   Harolyn Rutherford Rasch, NP   BP 127/76 mmHg  Pulse 66  Temp(Src) 98.4 F (36.9 C) (Oral)  Resp 16  Ht 5' (1.524 m)  Wt 58.06 kg  BMI 25.00 kg/m2  SpO2 99%  LMP 02/01/2015 Physical Exam  Constitutional: She is oriented to person, place, and time. Vital signs are normal. She appears well-developed and well-nourished.  Non-toxic appearance. No distress.  Afebrile, nontoxic, NAD  HENT:  Head: Normocephalic and atraumatic.  Mouth/Throat: Oropharynx is clear and moist and mucous membranes are normal.  Eyes: Conjunctivae and EOM are normal. Right eye exhibits no discharge. Left eye exhibits no discharge.  Neck: Normal range of motion. Neck supple.  Cardiovascular: Normal rate, regular rhythm, normal heart sounds and intact distal pulses.  Exam reveals no gallop and no friction rub.   No murmur heard. Pulmonary/Chest:  Effort normal and breath sounds normal. No respiratory distress. She has no decreased breath sounds. She has no wheezes. She has no rhonchi. She has no rales.  Abdominal: Soft. Normal appearance and bowel sounds are normal. She exhibits no distension. There is tenderness in the right lower quadrant, suprapubic area and left lower quadrant. There is CVA tenderness (R sided). There is no rigidity, no rebound, no guarding, no tenderness at McBurney's point and negative Murphy's sign.    Soft, nondistended, +BS throughout, with diffuse lower abd TTP along pelvic brim, no r/g/r, neg murphy's, neg mcburney's, with mild R-sided CVA TTP   Genitourinary: Uterus normal. Pelvic exam was performed with patient supine. There is no rash, tenderness or lesion on the right labia. There is no rash, tenderness or lesion on the left labia. Cervix exhibits motion tenderness, discharge and friability. Right adnexum displays tenderness. Right adnexum displays no mass and no fullness. Left adnexum displays tenderness. Left adnexum displays no mass and no fullness. No erythema, tenderness or bleeding in the vagina. Vaginal discharge found.  Chaperone present for exam. No rashes, lesions, or tenderness to external genitalia. No erythema, injury, or tenderness to vaginal mucosa. +Mucoid thick vaginal discharge without bleeding within vaginal vault. No adnexal masses or fullness, but with mild b/l adnexal TTP. +CMT, +  cervical friability, +mucoid discharge from cervical os. Uterus non-deviated, mobile, nonTTP, and without enlargement.    Musculoskeletal: Normal range of motion.  Neurological: She is alert and oriented to person, place, and time. She has normal strength. No sensory deficit.  Skin: Skin is warm, dry and intact. No rash noted.  Psychiatric: She has a normal mood and affect.  Nursing note and vitals reviewed.   ED Course  Procedures (including critical care time) Labs Review Labs Reviewed  WET PREP, GENITAL -  Abnormal; Notable for the following:    Clue Cells Wet Prep HPF POC PRESENT (*)    WBC, Wet Prep HPF POC MANY (*)    All other components within normal limits  COMPREHENSIVE METABOLIC PANEL - Abnormal; Notable for the following:    CO2 21 (*)    Albumin 3.3 (*)    AST 97 (*)    ALT 69 (*)    All other components within normal limits  URINE CULTURE  LIPASE, BLOOD  CBC  DIFFERENTIAL  URINALYSIS, ROUTINE W REFLEX MICROSCOPIC (NOT AT [facility])  RPR  GC/CHLAMYDIA PROBE AMP ([place]) NOT AT [facility]   Results for PROVIDENCIA, HOTTENSTEIN (MRN [id number]) as of 02/14/2015 12:23  Ref. Range 02/12/2015 06:55 02/12/2015 07:45  Appearance Latest Ref Range: CLEAR  HAZY (A)   Bacteria, UA Latest Ref Range: NONE SEEN  FEW (A)   Bilirubin Urine Latest Ref Range: NEGATIVE  NEGATIVE   Color, Urine Latest Ref Range: YELLOW  YELLOW   Glucose Latest Ref Range: NEGATIVE mg/dL NEGATIVE   Hgb urine dipstick Latest Ref Range: NEGATIVE  NEGATIVE   Ketones, ur Latest Ref Range: NEGATIVE mg/dL 15 (A)   Leukocytes, UA Latest Ref Range: NEGATIVE  MODERATE (A)   Nitrite Latest Ref Range: NEGATIVE  NEGATIVE   pH Latest Ref Range: 5.0-8.0  6.0   Protein Latest Ref Range: NEGATIVE mg/dL NEGATIVE   RBC / HPF Latest Ref Range: 0-5 RBC/hpf NONE SEEN   Specific Gravity, Urine Latest Ref Range: 1.005-1.030  1.025   Squamous Epithelial / LPF Latest Ref Range: NONE SEEN  0-5 (A)   Urine-Other Unknown MUCOUS PRESENT   WBC, UA Latest Ref Range: 0-5 WBC/hpf 6-30   Preg Test, Ur Latest Ref Range: NEGATIVE   NEGATIVE   Imaging Review US Transvaginal Non-ob  02/14/2015  CLINICAL DATA:  Patient with bilateral pelvic pain. EXAM: TRANSABDOMINAL AND TRANSVAGINAL ULTRASOUND OF PELVIS DOPPLER ULTRASOUND OF OVARIES TECHNIQUE: Both transabdominal and transvaginal ultrasound examinations of the pelvis were performed. Transabdominal technique was performed for global imaging of the pelvis including uterus, ovaries, adnexal regions, and pelvic cul-de-sac.  It was necessary to proceed with endovaginal exam following the transabdominal exam to visualize the adnexal structures. Color and duplex Doppler ultrasound was utilized to evaluate blood flow to the ovaries. COMPARISON:  None. FINDINGS: Uterus Measurements: 8.1 x 4.3 x 4.8 cm. No fibroids or other mass visualized. Endometrium Thickness: 7.6 mm.  No focal abnormality visualized. Right ovary Measurements: 3.5 x 1.9 x 2.6 cm. Normal appearance/no adnexal mass. Left ovary Measurements: 3.7 x 1.9 x 2.1 cm. Normal appearance/no adnexal mass. Pulsed Doppler evaluation of both ovaries demonstrates normal low-resistance arterial and venous waveforms. Other findings No abnormal free fluid. IMPRESSION: Unremarkable pelvic ultrasound. No sonographic evidence to suggest ovarian torsion. Electronically Signed   By: Annia Belt M.D.   On: 02/14/2015 15:06   US Pelvis Complete  02/14/2015  CLINICAL DATA:  Patient with bilateral pelvic pain. EXAM: TRANSABDOMINAL AND TRANSVAGINAL ULTRASOUND OF PELVIS  DOPPLER ULTRASOUND OF OVARIES TECHNIQUE: Both transabdominal and transvaginal ultrasound examinations of the pelvis were performed. Transabdominal technique was performed for global imaging of the pelvis including uterus, ovaries, adnexal regions, and pelvic cul-de-sac. It was necessary to proceed with endovaginal exam following the transabdominal exam to visualize the adnexal structures. Color and duplex Doppler ultrasound was utilized to evaluate blood flow to the ovaries. COMPARISON:  None. FINDINGS: Uterus Measurements: 8.1 x 4.3 x 4.8 cm. No fibroids or other mass visualized. Endometrium Thickness: 7.6 mm.  No focal abnormality visualized. Right ovary Measurements: 3.5 x 1.9 x 2.6 cm. Normal appearance/no adnexal mass. Left ovary Measurements: 3.7 x 1.9 x 2.1 cm. Normal appearance/no adnexal mass. Pulsed Doppler evaluation of both ovaries demonstrates normal low-resistance arterial and venous waveforms. Other findings No abnormal  free fluid. IMPRESSION: Unremarkable pelvic ultrasound. No sonographic evidence to suggest ovarian torsion. Electronically Signed   By: Annia Belt M.D.   On: 02/14/2015 15:06   [place] Art/ven Flow Abd Pelv Doppler  02/14/2015  CLINICAL DATA:  Patient with bilateral pelvic pain. EXAM: TRANSABDOMINAL AND TRANSVAGINAL ULTRASOUND OF PELVIS DOPPLER ULTRASOUND OF OVARIES TECHNIQUE: Both transabdominal and transvaginal ultrasound examinations of the pelvis were performed. Transabdominal technique was performed for global imaging of the pelvis including uterus, ovaries, adnexal regions, and pelvic cul-de-sac. It was necessary to proceed with endovaginal exam following the transabdominal exam to visualize the adnexal structures. Color and duplex Doppler ultrasound was utilized to evaluate blood flow to the ovaries. COMPARISON:  None. FINDINGS: Uterus Measurements: 8.1 x 4.3 x 4.8 cm. No fibroids or other mass visualized. Endometrium Thickness: 7.6 mm.  No focal abnormality visualized. Right ovary Measurements: 3.5 x 1.9 x 2.6 cm. Normal appearance/no adnexal mass. Left ovary Measurements: 3.7 x 1.9 x 2.1 cm. Normal appearance/no adnexal mass. Pulsed Doppler evaluation of both ovaries demonstrates normal low-resistance arterial and venous waveforms. Other findings No abnormal free fluid. IMPRESSION: Unremarkable pelvic ultrasound. No sonographic evidence to suggest ovarian torsion. Electronically Signed   By: Annia Belt M.D.   On: 02/14/2015 15:06   I have personally reviewed and evaluated these images and lab results as part of my medical decision-making.   EKG Interpretation None      MDM   Final diagnoses:  Lower abdominal pain  Right flank pain  Adnexal tenderness  Cervical motion tenderness  PID (acute pelvic inflammatory disease)  BV (bacterial vaginosis)  Vaginal discharge  HIV disease (HCC)  Elevated transaminase level  Dysuria  Non-intractable vomiting with nausea, vomiting of unspecified type      25 y.o. female here with 2wks of intermittent lower abd pain, some dysuria and vaginal discharge. Was seen 2 days ago at [facility] hospital, declined pelvic at that time, was diagnosed with UTI and started on bactrim, Upreg neg at that time. Pain now into the R flank as well. On exam, diffuse lower abdomen TTP with mild R flank tenderness. Will proceed with labs and pelvic exam, will get another U/A and culture it, will get STD testing (aside from HIV since pt has known HIV). Pt declines wanting anything for pain. Could be pyelonephritis, but doubt need for imaging. Will reassess shortly   1:01 PM Pelvic reveals mucoid d/c in vaginal vault from cervix, +adnexal tenderness bilaterally, +CMT, +cervical friability. Will obtain u/s, and empirically treat for GC/CT. Will likely need treatment for PID as well. Labs thus far show CBC w/diff unremarkable. Lipase and CMP pending. U/A and UCx not yet obtained. Will reassess after these, as well as  after pelvic U/S. Pt continues to decline wanting anything for pain.   3:10 PM Wet prep with +clue cells and many WBCs. Will tx for BV on top of PID, so she'll go home with doxycycline and flagyl. U/S negative, tenderness could be just from the ?PID. Still awaiting U/A which was just collected. Will reassess shortly.   3:25 PM Pt now vomiting, will give zofran then see if this improves prior to d/c. Still awaiting U/A although it's been collected. Given that she has UTI symptoms and had a U/A the other day that showed possible UTI, will likely just continue the bactrim she was started on. Will reassess shortly once she gets zofran  3:36 PM Nausea better, tolerating PO well. U/A and UCx pending but doubt this will change management since she had the U/A 2 days ago that diagnosed her with UTI, will just continue bactrim. Discussed staying hydrated, zofran rx given as well. F/up with OBGYN in 1wk. Rx for flagyl and doxy given, discussed avoidance of alcohol and sunlight  exposure. I explained the diagnosis and have given explicit precautions to return to the ER including for any other new or worsening symptoms. The patient understands and accepts the medical plan as it's been dictated and I have answered their questions. Discharge instructions concerning home care and prescriptions have been given. The patient is STABLE and is discharged to home in good condition.  BP 127/76 mmHg  Pulse 66  Temp(Src) 98.4 F (36.9 C) (Oral)  Resp 16  Ht 5' (1.524 m)  Wt 58.06 kg  BMI 25.00 kg/m2  SpO2 99%  LMP 02/01/2015  Meds ordered this encounter  Medications  . azithromycin (ZITHROMAX) tablet 1,000 mg    Sig:    And  . cefTRIAXone (ROCEPHIN) injection 250 mg    Sig:     Order Specific Question:  Antibiotic Indication:    Answer:  STD  . DISCONTD: lidocaine (PF) (XYLOCAINE) 1 % injection 1 mL    Sig:   . lidocaine (PF) (XYLOCAINE) 1 % injection    Sig:     Ermalinda Memos   : cabinet override  . doxycycline (VIBRAMYCIN) 100 MG capsule    Sig: Take 1 capsule (100 mg total) by mouth 2 (two) times daily. One po bid x 14 days    Dispense:  28 capsule    Refill:  0    Order Specific Question:  Supervising Provider    Answer:  MILLER, BRIAN [[id number]]  . metroNIDAZOLE (FLAGYL) 500 MG tablet    Sig: Take 1 tablet (500 mg total) by mouth 2 (two) times daily. One po bid x 7 days    Dispense:  14 tablet    Refill:  0    Order Specific Question:  Supervising Provider    Answer:  Hyacinth Meeker, BRIAN [[id number]]  . ondansetron (ZOFRAN-ODT) disintegrating tablet 8 mg    Sig:   . ondansetron (ZOFRAN ODT) 4 MG disintegrating tablet    Sig: Take 1 tablet (4 mg total) by mouth every 8 (eight) hours as needed for nausea or vomiting.    Dispense:  10 tablet    Refill:  0    Order Specific Question:  Supervising Provider    Answer:  Eber Hong [[id number]]     Marquavis Hannen Camprubi-Soms, PA-C 02/14/15 1538  Doug Sou, MD 02/14/15 1610

## 2015-02-14 NOTE — ED Notes (Signed)
Pt was seen at [facility] hospital on Thursday for abd pain, treated for UTI-- states pain is getting worse. Denies vag discharge-- states last period was longer than normal and abnormal bleeding.

## 2015-02-15 LAB — URINE CULTURE: Culture: NO GROWTH

## 2015-02-15 LAB — RPR: RPR Ser Ql: NONREACTIVE

## 2015-02-16 LAB — GC/CHLAMYDIA PROBE AMP (~~LOC~~) NOT AT ARMC
CHLAMYDIA, DNA PROBE: NEGATIVE
NEISSERIA GONORRHEA: POSITIVE — AB

## 2015-02-17 ENCOUNTER — Telehealth (HOSPITAL_COMMUNITY): Payer: Self-pay

## 2015-02-17 NOTE — Telephone Encounter (Signed)
Positive for gonorrhea. Treated per protocol. [employer] form faxed. Attempting to contact. Unable to reach by telephone. Letter sent to address on record.  

## 2015-02-18 ENCOUNTER — Telehealth (HOSPITAL_COMMUNITY): Payer: Self-pay

## 2015-02-18 NOTE — Telephone Encounter (Signed)
(+)   gonorrhea  Pt ID verified w/DOB and address. Pt informed of Dx, tx rcvd approp., notify partner(s) for testing and tx and abstain from sex x 2 wks.

## 2015-02-24 NOTE — Telephone Encounter (Signed)
Sarah Rivers called back.  She has been in [place].  She said she will pick up HIV meds.  Thanked me for calling

## 2015-02-26 MED FILL — DESCOVY 200-25 MG TABS: 200-25 | 30 days supply | Qty: 30 | Fill #2

## 2015-02-26 MED FILL — TIVICAY 50 MG TABLET: 50 | 30 days supply | Qty: 30 | Fill #2

## 2015-03-17 ENCOUNTER — Ambulatory Visit: Payer: Managed Care, Other (non HMO) | Admitting: Internal Medicine

## 2015-03-23 ENCOUNTER — Ambulatory Visit: Payer: Managed Care, Other (non HMO) | Admitting: Internal Medicine

## 2015-03-27 MED FILL — DESCOVY 200-25 MG TABS: 200-25 | 30 days supply | Qty: 30 | Fill #3

## 2015-03-27 MED FILL — TIVICAY 50 MG TABLET: 50 | 30 days supply | Qty: 30 | Fill #3

## 2015-04-06 ENCOUNTER — Encounter: Payer: Self-pay | Admitting: Internal Medicine

## 2015-04-06 ENCOUNTER — Ambulatory Visit (INDEPENDENT_AMBULATORY_CARE_PROVIDER_SITE_OTHER): Payer: Managed Care, Other (non HMO) | Admitting: Internal Medicine

## 2015-04-06 VITALS — BP 110/70 | HR 72 | Temp 97.5°F | Wt 126.0 lb

## 2015-04-06 DIAGNOSIS — R110 Nausea: Secondary | ICD-10-CM

## 2015-04-06 DIAGNOSIS — B181 Chronic viral hepatitis B without delta-agent: Secondary | ICD-10-CM | POA: Diagnosis not present

## 2015-04-06 DIAGNOSIS — B20 Human immunodeficiency virus [HIV] disease: Secondary | ICD-10-CM

## 2015-04-06 MED ORDER — ONDANSETRON 4 MG PO TBDP: 4.0000 mg | ORAL_TABLET | Freq: Three times a day (TID) | ORAL | Status: DC | PRN

## 2015-04-06 MED FILL — ONDANSETRON ODT 4 MG TABLET: 4 | 7 days supply | Qty: 20 | Fill #0

## 2015-04-06 NOTE — Progress Notes (Signed)
Patient ID: Sarah Rivers, female   DOB: July 08, 1990, 25 y.o.   MRN: [id number]       Patient ID: Sarah Rivers, female   DOB: May 15, 1990, 25 y.o.   MRN: [id number]  HPI 25 yo F with HIv disease, CD 4 count of 660/VL<20, on DLG-descovy. She states that though she takes her medications daily, she is often intolerant of medication due to Chronic nausea with occasional vomiting. She also she reports irregular BM, has urgency at times that has been long standing for several years. She has not had any GI work up in the past, but mentioned she was going to be referred to GI prior to relocating to [facility].   She mentions that after eating some meals she has urgency to have BM. Has nausea more times than not. She is trying to minimize meat from her diet, think that this may help her symptoms. She denies abdominal pain, or epigastric pain after eating.  Outpatient Encounter Prescriptions as of 04/06/2015  Medication Sig  . dolutegravir (TIVICAY) 50 MG tablet Take 1 tablet (50 mg total) by mouth daily.  Marland Kitchen emtricitabine-tenofovir AF (DESCOVY) 200-25 MG tablet Take 1 tablet by mouth daily.  . [DISCONTINUED] sulfamethoxazole-trimethoprim (BACTRIM DS,SEPTRA DS) 800-160 MG tablet Take 1 tablet by mouth 2 (two) times daily.  . ondansetron (ZOFRAN ODT) 4 MG disintegrating tablet Take 1 tablet (4 mg total) by mouth every 8 (eight) hours as needed for nausea or vomiting.  . [DISCONTINUED] doxycycline (VIBRAMYCIN) 100 MG capsule Take 1 capsule (100 mg total) by mouth 2 (two) times daily. One po bid x 14 days  . [DISCONTINUED] metroNIDAZOLE (FLAGYL) 500 MG tablet Take 1 tablet (500 mg total) by mouth 2 (two) times daily. One po bid x 7 days  . [DISCONTINUED] ondansetron (ZOFRAN ODT) 4 MG disintegrating tablet Take 1 tablet (4 mg total) by mouth every 8 (eight) hours as needed for nausea or vomiting.   No facility-administered encounter medications on file as of 04/06/2015.     Patient Active Problem List   Diagnosis Date  Noted  . HIV disease (HCC) 11/21/2014  . Chronic hepatitis B (HCC) 11/21/2014  . Chronic nausea 11/21/2014     Health Maintenance Due  Topic Date Due  . TETANUS/TDAP  05/04/2010  . PAP SMEAR  05/03/2012  . INFLUENZA VACCINE  08/11/2014     Review of Systems + nausea, vomiting. Occasional loose stool, has urgency for defecation at times. Physical Exam   BP 110/70 mmHg  Pulse 72  Temp(Src) 97.5 F (36.4 C) (Oral)  Wt 126 lb (57.153 kg)  LMP 04/01/2015 Physical Exam  Constitutional:  oriented to person, place, and time. appears well-developed and well-nourished. No distress.  HENT: [place]/AT, PERRLA, no scleral icterus Mouth/Throat: Oropharynx is clear and moist. No oropharyngeal exudate.  Cardiovascular: Normal rate, regular rhythm and normal heart sounds. Exam reveals no gallop and no friction rub.  No murmur heard.  Pulmonary/Chest: Effort normal and breath sounds normal. No respiratory distress.  has no wheezes.  Neck = supple, no nuchal rigidity Abdominal: Soft. Bowel sounds are normal.  exhibits no distension. There is no tenderness.  Lymphadenopathy: no cervical adenopathy. No axillary adenopathy Neurological: alert and oriented to person, place, and time.  Skin: Skin is warm and dry. No rash noted. No erythema.  Psychiatric: a normal mood and affect.  behavior is normal.   Lab Results  Component Value Date   CD4TCELL 31* 11/06/2014   Lab Results  Component Value Date   CD4TABS  660 11/06/2014   Lab Results  Component Value Date   HIV1RNAQUANT 458* 11/20/2014   Lab Results  Component Value Date   HEPBSAB NEG 11/06/2014   No results found for: RPR  CBC Lab Results  Component Value Date   WBC 8.2 02/14/2015   RBC 3.93 02/14/2015   HGB 12.5 02/14/2015   HCT 36.8 02/14/2015   PLT 202 02/14/2015   MCV 93.6 02/14/2015   MCH 31.8 02/14/2015   MCHC 34.0 02/14/2015   RDW 12.9 02/14/2015   LYMPHSABS 2.4 02/14/2015   MONOABS 0.6 02/14/2015   EOSABS 0.1  02/14/2015   BASOSABS 0.0 02/14/2015   BMET Lab Results  Component Value Date   NA 139 02/14/2015   K 4.4 02/14/2015   CL 107 02/14/2015   CO2 21* 02/14/2015   GLUCOSE 82 02/14/2015   BUN 7 02/14/2015   CREATININE 0.70 02/14/2015   CALCIUM 9.2 02/14/2015   GFRNONAA >60 02/14/2015   GFRAA >60 02/14/2015     Assessment and Plan  hiv = concern that her nausea/vomiting, may have compromised her viral load. We will check labs. Continue with taking tivicay and descovy. Both of these relatively low side effect profile  Chronic hep b = will check hep b vl to see htat she is suppressed now that she is back on emtricitabine-tenofovir AF.  Nausea/vomiting =ask her to take zofran concurrently with her HIV meds since it appears her symptoms appear after she takes her breakfast and medications (prior to going to bed)   ? ibs vs. Celiac = will refer to gi. In the meantime will check celiac labs. Give her fodmap diet to see if it improves her symptoms  Birthcontrol = not interested in taking depo or OCP. Not sexually active at this time. Reviewed the importance of using condoms if she is to be sexually active.  Health maintenance = needs pap. Will need to check if she has had HPV vaccine series

## 2015-04-07 LAB — TISSUE TRANSGLUTAMINASE, IGA: Tissue Transglutaminase Ab, IgA: 1 U/mL (ref <4)

## 2015-04-08 LAB — HIV-1 RNA QUANT-NO REFLEX-BLD
HIV 1 RNA QUANT: 4418 {copies}/mL — AB (ref <20)
HIV-1 RNA Quant, Log: 3.65 Log copies/mL — ABNORMAL HIGH (ref <1.30)

## 2015-04-08 LAB — T-HELPER CELL (CD4) - (RCID CLINIC ONLY)
CD4 % Helper T Cell: 30 % — ABNORMAL LOW (ref 33–55)
CD4 T Cell Abs: 710 /uL (ref 400–2700)

## 2015-04-09 LAB — HEPATITIS B DNA, ULTRAQUANTITATIVE, PCR
Hepatitis B DNA (Calc): >8.23 Log IU/mL — ABNORMAL HIGH (ref <1.30)
Hepatitis B DNA: >170000000 IU/mL — ABNORMAL HIGH (ref <20)

## 2015-04-27 MED FILL — DESCOVY 200-25 MG TABS: 200-25 | 30 days supply | Qty: 30 | Fill #4

## 2015-04-27 MED FILL — TIVICAY 50 MG TABLET: 50 | 30 days supply | Qty: 30 | Fill #4

## 2015-05-26 MED FILL — TIVICAY 50 MG TABLET: 50 | 30 days supply | Qty: 30 | Fill #5

## 2015-05-26 MED FILL — DESCOVY 200-25 MG TABS: 200-25 | 30 days supply | Qty: 30 | Fill #5

## 2015-06-10 ENCOUNTER — Other Ambulatory Visit: Payer: Self-pay | Admitting: *Deleted

## 2015-06-10 DIAGNOSIS — B1910 Unspecified viral hepatitis B without hepatic coma: Secondary | ICD-10-CM

## 2015-06-10 DIAGNOSIS — B20 Human immunodeficiency virus [HIV] disease: Secondary | ICD-10-CM

## 2015-07-01 ENCOUNTER — Other Ambulatory Visit: Payer: Managed Care, Other (non HMO)

## 2015-07-01 DIAGNOSIS — B20 Human immunodeficiency virus [HIV] disease: Secondary | ICD-10-CM

## 2015-07-01 DIAGNOSIS — B1910 Unspecified viral hepatitis B without hepatic coma: Secondary | ICD-10-CM

## 2015-07-01 LAB — CBC WITH DIFFERENTIAL/PLATELET
BASOS ABS: 0 {cells}/uL (ref 0–200)
BASOS PCT: 0 %
EOS PCT: 1 %
Eosinophils Absolute: 62 cells/uL (ref 15–500)
HCT: 38.9 % (ref 35.0–45.0)
Hemoglobin: 13.4 g/dL (ref 11.7–15.5)
Lymphocytes Relative: 47 %
Lymphs Abs: 2914 cells/uL (ref 850–3900)
MCH: 32.1 pg (ref 27.0–33.0)
MCHC: 34.4 g/dL (ref 32.0–36.0)
MCV: 93.1 fL (ref 80.0–100.0)
MONOS PCT: 7 %
MPV: 9.9 fL (ref 7.5–12.5)
Monocytes Absolute: 434 cells/uL (ref 200–950)
NEUTROS ABS: 2790 {cells}/uL (ref 1500–7800)
Neutrophils Relative %: 45 %
PLATELETS: 225 10*3/uL (ref 140–400)
RBC: 4.18 MIL/uL (ref 3.80–5.10)
RDW: 13.1 % (ref 11.0–15.0)
WBC: 6.2 10*3/uL (ref 3.8–10.8)

## 2015-07-01 LAB — COMPLETE METABOLIC PANEL WITH GFR
ALK PHOS: 46 U/L (ref 33–115)
ALT: 24 U/L (ref 6–29)
AST: 32 U/L — ABNORMAL HIGH (ref 10–30)
Albumin: 3.5 g/dL — ABNORMAL LOW (ref 3.6–5.1)
BILIRUBIN TOTAL: 1.4 mg/dL — AB (ref 0.2–1.2)
BUN: 11 mg/dL (ref 7–25)
CO2: 22 mmol/L (ref 20–31)
Calcium: 9.1 mg/dL (ref 8.6–10.2)
Chloride: 107 mmol/L (ref 98–110)
Creat: 0.81 mg/dL (ref 0.50–1.10)
GFR, Est African American: >89 mL/min (ref >=60)
GLUCOSE: 97 mg/dL (ref 65–99)
POTASSIUM: 4.1 mmol/L (ref 3.5–5.3)
SODIUM: 138 mmol/L (ref 135–146)
TOTAL PROTEIN: 8.1 g/dL (ref 6.1–8.1)

## 2015-07-02 LAB — T-HELPER CELL (CD4) - (RCID CLINIC ONLY)
CD4 % Helper T Cell: 28 % — ABNORMAL LOW (ref 33–55)
CD4 T Cell Abs: 900 /uL (ref 400–2700)

## 2015-07-02 LAB — HIV-1 RNA QUANT-NO REFLEX-BLD
HIV 1 RNA Quant: <20 copies/mL (ref <20)
HIV-1 RNA Quant, Log: <1.3 Log copies/mL (ref <1.30)

## 2015-07-03 LAB — HEPATITIS B DNA, ULTRAQUANTITATIVE, PCR
HEPATITIS B DNA (CALC): 6.29 {Log_IU}/mL — AB (ref <1.30)
HEPATITIS B DNA: 1950000 [IU]/mL — AB (ref <20)

## 2015-07-16 ENCOUNTER — Ambulatory Visit: Payer: Managed Care, Other (non HMO) | Admitting: Internal Medicine

## 2015-07-20 ENCOUNTER — Telehealth: Payer: Self-pay | Admitting: Pharmacy Technician

## 2015-07-20 NOTE — Telephone Encounter (Signed)
Left message to call.  She has not picked up June's HIV meds.

## 2015-08-05 ENCOUNTER — Ambulatory Visit: Payer: Managed Care, Other (non HMO) | Admitting: Internal Medicine

## 2015-08-11 ENCOUNTER — Ambulatory Visit: Payer: Managed Care, Other (non HMO) | Admitting: Internal Medicine

## 2015-08-19 ENCOUNTER — Ambulatory Visit (INDEPENDENT_AMBULATORY_CARE_PROVIDER_SITE_OTHER): Payer: Managed Care, Other (non HMO) | Admitting: Internal Medicine

## 2015-08-19 ENCOUNTER — Encounter: Payer: Self-pay | Admitting: Internal Medicine

## 2015-08-19 VITALS — BP 111/72 | HR 58 | Temp 98.3°F | Wt 123.4 lb

## 2015-08-19 DIAGNOSIS — B20 Human immunodeficiency virus [HIV] disease: Secondary | ICD-10-CM | POA: Diagnosis not present

## 2015-08-19 DIAGNOSIS — B181 Chronic viral hepatitis B without delta-agent: Secondary | ICD-10-CM | POA: Diagnosis not present

## 2015-08-19 NOTE — Progress Notes (Signed)
Patient ID: Sarah Rivers, female   DOB: 04-28-1990, 25 y.o.   MRN: [id number]  HPI Sarah Rivers is a 25yo F with HIV disease-chronic hepatitis B, CD 4 count of 900/VL<20, currently descovy-tivicay. She has been taking the medications regularly. Initially had nausea but now much improved and only uses zofran as needed. No other health problems. She is interested in finding dentist.  Soc hx: not in relationship. 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 dolutegravir (TIVICAY) 50 MG tablet Take 1 tablet (50 mg total) by mouth daily.  Marland Kitchen. emtricitabine-tenofovir AF (DESCOVY) 200-25 MG tablet Take 1 tablet by mouth daily.  . ondansetron (ZOFRAN ODT) 4 MG disintegrating tablet Take 1 tablet (4 mg total) by mouth every 8 (eight) hours as needed for nausea or vomiting.   No facility-administered encounter medications on file as of 08/19/2015.      Patient Active Problem List   Diagnosis Date Noted  . HIV disease (HCC) 11/21/2014  . Chronic hepatitis B (HCC) 11/21/2014  . Chronic nausea 11/21/2014     Health Maintenance Due  Topic Date Due  . TETANUS/TDAP  05/04/2010  . PAP SMEAR  05/03/2012  . INFLUENZA VACCINE  08/11/2015     Review of Systems  Constitutional: Negative for fever, chills, diaphoresis, activity change, appetite change, fatigue and unexpected weight change.  HENT: Negative for congestion, sore throat, rhinorrhea, sneezing, trouble swallowing and sinus pressure.  Eyes: Negative for photophobia and visual disturbance.  Respiratory: Negative for cough, chest tightness, shortness of breath, wheezing and stridor.  Cardiovascular: Negative for chest pain, palpitations and leg swelling.  Gastrointestinal: Negative for nausea, vomiting, abdominal pain, diarrhea, constipation, blood in stool, abdominal distention and anal bleeding.  Genitourinary: Negative for dysuria, hematuria,  flank pain and difficulty urinating.  Musculoskeletal: Negative for myalgias, back pain, joint swelling, arthralgias and gait problem.  Skin: Negative for color change, pallor, rash and wound.  Neurological: Negative for dizziness, tremors, weakness and light-headedness.  Hematological: Negative for adenopathy. Does not bruise/bleed easily.  Psychiatric/Behavioral: Negative for behavioral problems, confusion, sleep disturbance, dysphoric mood, decreased concentration and agitation.    Physical Exam   BP 111/72 (BP Location: Right Arm, Patient Position: Sitting, Cuff Size: Normal)   Pulse (!) 58   Temp 98.3 F (36.8 C) (Oral)   Wt 123 lb 6.4 oz (56 kg)   LMP 08/01/2015   BMI 24.10 kg/m  Physical Exam  Constitutional:  oriented to person, place, and time. appears well-developed and well-nourished. No distress.  HENT: [place]/AT, PERRLA, no scleral icterus Mouth/Throat: Oropharynx is clear and moist. No oropharyngeal exudate.  Cardiovascular: Normal rate, regular rhythm and normal heart sounds. Exam reveals no gallop and no friction rub.  No murmur heard.  Pulmonary/Chest: Effort normal and breath sounds normal. No respiratory distress.  has no wheezes.  Neck = supple, no nuchal rigidity Abdominal: Soft. Bowel sounds are normal.  exhibits no distension. There is no tenderness.  Lymphadenopathy: no cervical adenopathy. No axillary adenopathy Neurological: alert and oriented to person, place, and time.  Skin: Skin is warm and dry. No rash noted. No erythema.  Psychiatric: a normal mood and affect.  behavior is normal.   Lab Results  Component Value Date   CD4TCELL 28 (L) 07/01/2015   Lab Results  Component Value Date   CD4TABS 900 07/01/2015   CD4TABS 710 04/06/2015   CD4TABS 660 11/06/2014  Lab Results  Component Value Date   HIV1RNAQUANT <20 07/01/2015   Lab Results  Component Value Date   HEPBSAB NEG 11/06/2014   No results found for: RPR  CBC Lab Results  Component  Value Date   WBC 6.2 07/01/2015   RBC 4.18 07/01/2015   HGB 13.4 07/01/2015   HCT 38.9 07/01/2015   PLT 225 07/01/2015   MCV 93.1 07/01/2015   MCH 32.1 07/01/2015   MCHC 34.4 07/01/2015   RDW 13.1 07/01/2015   LYMPHSABS 2,914 07/01/2015   MONOABS 434 07/01/2015   EOSABS 62 07/01/2015   BASOSABS 0 07/01/2015   BMET Lab Results  Component Value Date   NA 138 07/01/2015   K 4.1 07/01/2015   CL 107 07/01/2015   CO2 22 07/01/2015   GLUCOSE 97 07/01/2015   BUN 11 07/01/2015   CREATININE 0.81 07/01/2015   CALCIUM 9.1 07/01/2015   GFRNONAA >89 07/01/2015   GFRAA >89 07/01/2015     Assessment and Plan   hiv disease = well controlled . Continue on current regimen  Chronic hepatitis B = her viremia is improved but last labs show VL 1.65M down from 170M+. We will recheck her hep B viral load at next visit.   Nausea = resolved. Now only using anti-emetic prn  Needs dental referral to clinic team  Health maintenance = Needs pap, will arrange appt with denise. Refer to gynecology if abn testing  Birth control? = previously had been on depo. She is not currently sexually active. Occasionally has irregular menses. We discussed options of restarting depo injections vs. Oral birth control pills. She is precontemplative. Will discuss at next visit. If sexually active, recommend condom use

## 2015-08-28 ENCOUNTER — Ambulatory Visit (INDEPENDENT_AMBULATORY_CARE_PROVIDER_SITE_OTHER): Payer: Managed Care, Other (non HMO) | Admitting: *Deleted

## 2015-08-28 ENCOUNTER — Other Ambulatory Visit (HOSPITAL_COMMUNITY)
Admission: RE | Admit: 2015-08-28 | Discharge: 2015-08-28 | Disposition: A | Discharge status: Home / Self Care | Payer: Managed Care, Other (non HMO) | Source: Ambulatory Visit | Attending: Internal Medicine | Admitting: Internal Medicine

## 2015-08-28 DIAGNOSIS — Z01419 Encounter for gynecological examination (general) (routine) without abnormal findings: Secondary | ICD-10-CM | POA: Diagnosis present

## 2015-08-28 DIAGNOSIS — Z113 Encounter for screening for infections with a predominantly sexual mode of transmission: Secondary | ICD-10-CM | POA: Insufficient documentation

## 2015-08-28 DIAGNOSIS — Z124 Encounter for screening for malignant neoplasm of cervix: Secondary | ICD-10-CM | POA: Diagnosis not present

## 2015-08-28 NOTE — Patient Instructions (Signed)
Your results will be ready in about a week.  I will send you a message in MyChart.  Thank you for coming to the Center for your care.  Denise, RN 

## 2015-08-28 NOTE — Progress Notes (Signed)
Subjective:     Sarah Rivers is a 25 y.o. woman who comes in today for a  pap smear only.  Previous abnormal Pap smears:  no. Contraception: condoms.     Objective:    LMP 08/01/2015   Creamy, white vaginal discharge present. Pelvic Exam: . Pap smear obtained.   Assessment:    Screening pap smear.   Plan:    Follow up in one year, or as indicated by Pap results.  Pt given educational materials re: HIV and women, self-esteem, BSE, nutrition and diet management, PAP smears and partner safety. Pt given condoms

## 2015-08-31 LAB — CERVICOVAGINAL ANCILLARY ONLY
Chlamydia: NEGATIVE
Neisseria Gonorrhea: NEGATIVE

## 2015-08-31 LAB — CYTOLOGY - PAP

## 2015-09-08 ENCOUNTER — Telehealth: Payer: Self-pay | Admitting: Pharmacy Technician

## 2015-09-24 ENCOUNTER — Telehealth: Payer: Self-pay | Admitting: Pharmacy Technician

## 2015-09-24 NOTE — Telephone Encounter (Signed)
She said she would pick up HIV meds after 4 mo's of not picking up.  St[facility] but never did. I offered to mail them to her, she declined. After repeated attempts to get her to pick up, I advised the pharmacy to put the meds back until she reaches out.

## 2015-09-25 MED FILL — DESCOVY 200-25 MG TABS: 200-25 | 30 days supply | Qty: 30 | Fill #6

## 2015-09-25 MED FILL — TIVICAY 50 MG TABLET: 50 | 30 days supply | Qty: 30 | Fill #6

## 2015-11-04 MED FILL — DESCOVY 200-25 MG TABS: 200-25 | 30 days supply | Qty: 30 | Fill #7

## 2015-11-04 MED FILL — TIVICAY 50 MG TABLET: 50 | 30 days supply | Qty: 30 | Fill #7

## 2015-11-19 ENCOUNTER — Encounter: Payer: Self-pay | Admitting: Internal Medicine

## 2015-11-19 ENCOUNTER — Ambulatory Visit (INDEPENDENT_AMBULATORY_CARE_PROVIDER_SITE_OTHER): Payer: Managed Care, Other (non HMO) | Admitting: Internal Medicine

## 2015-11-19 VITALS — BP 119/74 | HR 60 | Temp 98.6°F | Wt 122.0 lb

## 2015-11-19 DIAGNOSIS — B181 Chronic viral hepatitis B without delta-agent: Secondary | ICD-10-CM

## 2015-11-19 DIAGNOSIS — Z23 Encounter for immunization: Secondary | ICD-10-CM

## 2015-11-19 DIAGNOSIS — B20 Human immunodeficiency virus [HIV] disease: Secondary | ICD-10-CM

## 2015-11-19 LAB — CBC WITH DIFFERENTIAL/PLATELET
Basophils Absolute: 0 cells/uL (ref 0–200)
Basophils Relative: 0 %
EOS PCT: 1 %
Eosinophils Absolute: 53 cells/uL (ref 15–500)
HCT: 39 % (ref 35.0–45.0)
Hemoglobin: 13 g/dL (ref 11.7–15.5)
LYMPHS ABS: 2014 {cells}/uL (ref 850–3900)
LYMPHS PCT: 38 %
MCH: 32.1 pg (ref 27.0–33.0)
MCHC: 33.3 g/dL (ref 32.0–36.0)
MCV: 96.3 fL (ref 80.0–100.0)
MPV: 10.2 fL (ref 7.5–12.5)
Monocytes Absolute: 424 cells/uL (ref 200–950)
Monocytes Relative: 8 %
NEUTROS PCT: 53 %
Neutro Abs: 2809 cells/uL (ref 1500–7800)
Platelets: 232 10*3/uL (ref 140–400)
RBC: 4.05 MIL/uL (ref 3.80–5.10)
RDW: 13.4 % (ref 11.0–15.0)
WBC: 5.3 10*3/uL (ref 3.8–10.8)

## 2015-11-19 LAB — COMPLETE METABOLIC PANEL WITH GFR
ALT: 11 U/L (ref 6–29)
AST: 18 U/L (ref 10–30)
Albumin: 4.1 g/dL (ref 3.6–5.1)
Alkaline Phosphatase: 40 U/L (ref 33–115)
BUN: 7 mg/dL (ref 7–25)
CHLORIDE: 105 mmol/L (ref 98–110)
CO2: 23 mmol/L (ref 20–31)
Calcium: 9 mg/dL (ref 8.6–10.2)
Creat: 0.71 mg/dL (ref 0.50–1.10)
GFR, Est African American: >89 mL/min (ref >=60)
GFR, Est Non African American: >89 mL/min (ref >=60)
GLUCOSE: 80 mg/dL (ref 65–99)
POTASSIUM: 3.9 mmol/L (ref 3.5–5.3)
SODIUM: 138 mmol/L (ref 135–146)
Total Bilirubin: 0.9 mg/dL (ref 0.2–1.2)
Total Protein: 7.7 g/dL (ref 6.1–8.1)

## 2015-11-19 NOTE — Progress Notes (Signed)
Patient ID: Sarah BimlerAmber Rivers, female   DOB: 1990/09/04, 25 y.o.   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 She is back together with her exboyfriend. Not currently sexually active. She has msk back paraspinal strain   Having early menses  Was off of meds for 1 week due to adap recertification Outpatient Encounter Prescriptions as of 11/19/2015  Medication Sig  . dolutegravir (TIVICAY) 50 MG tablet Take 1 tablet (50 mg total) by mouth daily.  Marland Kitchen. emtricitabine-tenofovir AF (DESCOVY) 200-25 MG tablet Take 1 tablet by mouth daily.  . ondansetron (ZOFRAN ODT) 4 MG disintegrating tablet Take 1 tablet (4 mg total) by mouth every 8 (eight) hours as needed for nausea or vomiting.   No facility-administered encounter medications on file as of 11/19/2015.      Patient Active Problem List   Diagnosis Date Noted  . HIV disease (HCC) 11/21/2014  . Chronic hepatitis B (HCC) 11/21/2014  . Chronic nausea 11/21/2014     Health Maintenance Due  Topic Date Due  . TETANUS/TDAP  05/04/2010  . INFLUENZA VACCINE  08/11/2015     Review of Systems Review of Systems  Constitutional: Negative for fever, chills, diaphoresis, activity change, appetite change, fatigue and unexpected weight change.  HENT: Negative for congestion, sore throat, rhinorrhea, sneezing, trouble swallowing and sinus pressure.  Eyes: Negative for photophobia and visual disturbance.  Respiratory: Negative for cough, chest tightness, shortness of breath, wheezing and stridor.  Cardiovascular: Negative for chest pain, palpitations and leg swelling.  Gastrointestinal: Negative for nausea, vomiting, abdominal pain, diarrhea, constipation, blood in stool, abdominal distention and anal bleeding.  Genitourinary: Negative for dysuria, hematuria, flank pain and difficulty urinating.  Musculoskeletal: Negative for myalgias, back pain, joint swelling,  arthralgias and gait problem.  Skin: Negative for color change, pallor, rash and wound.  Neurological: Negative for dizziness, tremors, weakness and light-headedness.  Hematological: Negative for adenopathy. Does not bruise/bleed easily.  Psychiatric/Behavioral: Negative for behavioral problems, confusion, sleep disturbance, dysphoric mood, decreased concentration and agitation.    Physical Exam   BP 119/74   Pulse 60   Temp 98.6 F (37 C) (Oral)   Wt 122 lb (55.3 kg)   LMP 11/17/2015   BMI 23.83 kg/m  Physical Exam  Constitutional:  oriented to person, place, and time. appears well-developed and well-nourished. No distress.  HENT: [place]/AT, PERRLA, no scleral icterus Mouth/Throat: Oropharynx is clear and moist. No oropharyngeal exudate.  Cardiovascular: Normal rate, regular rhythm and normal heart sounds. Exam reveals no gallop and no friction rub.  No murmur heard.  Pulmonary/Chest: Effort normal and breath sounds normal. No respiratory distress.  has no wheezes.  Neck = supple, no nuchal rigidity Abdominal: Soft. Bowel sounds are normal.  exhibits no distension. There is no tenderness.  Lymphadenopathy: no cervical adenopathy. No axillary adenopathy Neurological: alert and oriented to person, place, and time.  Skin: Skin is warm and dry. No rash noted. No erythema.  Psychiatric: a normal mood and affect.  behavior is normal.   Lab Results  Component Value Date   CD4TCELL 28 (L) 07/01/2015   Lab Results  Component Value Date   CD4TABS 900 07/01/2015   CD4TABS 710 04/06/2015   CD4TABS 660 11/06/2014   Lab Results  Component Value Date   HIV1RNAQUANT <20 07/01/2015   Lab Results  Component Value Date   HEPBSAB NEG 11/06/2014   No results found for: RPR  CBC Lab Results  Component Value Date   WBC 6.2 07/01/2015   RBC 4.18 07/01/2015   HGB 13.4 07/01/2015   HCT 38.9 07/01/2015   PLT 225 07/01/2015   MCV 93.1 07/01/2015   MCH 32.1 07/01/2015   MCHC 34.4  07/01/2015   RDW 13.1 07/01/2015   LYMPHSABS 2,914 07/01/2015   MONOABS 434 07/01/2015   EOSABS 62 07/01/2015   BASOSABS 0 07/01/2015   BMET Lab Results  Component Value Date   NA 138 07/01/2015   K 4.1 07/01/2015   CL 107 07/01/2015   CO2 22 07/01/2015   GLUCOSE 97 07/01/2015   BUN 11 07/01/2015   CREATININE 0.81 07/01/2015   CALCIUM 9.1 07/01/2015   GFRNONAA >89 07/01/2015   GFRAA >89 07/01/2015     Assessment and Plan  hiv disease = well controlled  hbv = will check viral load  Birth control = using condoms if having sex  Health maintenance = deferring flu shot. Will give pneumococcal vaccine

## 2015-11-21 LAB — HIV-1 RNA QUANT-NO REFLEX-BLD
HIV 1 RNA Quant: <20 copies/mL (ref <20)
HIV-1 RNA Quant, Log: <1.3 Log copies/mL (ref <1.30)

## 2015-11-23 LAB — HEPATITIS B DNA, ULTRAQUANTITATIVE, PCR
HEPATITIS B DNA (CALC): 7.55 {Log_IU}/mL — AB (ref <1.30)
Hepatitis B DNA: 35205929 IU/mL — ABNORMAL HIGH (ref <20)

## 2015-12-04 ENCOUNTER — Other Ambulatory Visit: Payer: Self-pay | Admitting: Internal Medicine

## 2015-12-04 DIAGNOSIS — B20 Human immunodeficiency virus [HIV] disease: Secondary | ICD-10-CM

## 2015-12-07 MED FILL — DESCOVY 200-25 MG TABS: 200-25 | 30 days supply | Qty: 30 | Fill #0

## 2015-12-07 MED FILL — TIVICAY 50 MG TABLET: 50 | 30 days supply | Qty: 30 | Fill #0

## 2015-12-17 ENCOUNTER — Encounter (HOSPITAL_COMMUNITY): Payer: Self-pay | Admitting: Emergency Medicine

## 2015-12-17 ENCOUNTER — Emergency Department (HOSPITAL_COMMUNITY)
Admission: EM | Admit: 2015-12-17 | Discharge: 2015-12-17 | Disposition: A | Discharge status: Home / Self Care | Payer: Managed Care, Other (non HMO) | Attending: Emergency Medicine | Admitting: Emergency Medicine

## 2015-12-17 DIAGNOSIS — R112 Nausea with vomiting, unspecified: Secondary | ICD-10-CM | POA: Diagnosis present

## 2015-12-17 DIAGNOSIS — R109 Unspecified abdominal pain: Secondary | ICD-10-CM | POA: Insufficient documentation

## 2015-12-17 LAB — COMPREHENSIVE METABOLIC PANEL
ALT: 80 U/L — AB (ref 14–54)
AST: 86 U/L — ABNORMAL HIGH (ref 15–41)
Albumin: 3.9 g/dL (ref 3.5–5.0)
Alkaline Phosphatase: 45 U/L (ref 38–126)
Anion gap: 7 (ref 5–15)
BILIRUBIN TOTAL: 1.9 mg/dL — AB (ref 0.3–1.2)
BUN: 7 mg/dL (ref 6–20)
CHLORIDE: 105 mmol/L (ref 101–111)
CO2: 25 mmol/L (ref 22–32)
CREATININE: 0.9 mg/dL (ref 0.44–1.00)
Calcium: 9.3 mg/dL (ref 8.9–10.3)
Glucose, Bld: 94 mg/dL (ref 65–99)
Potassium: 3.6 mmol/L (ref 3.5–5.1)
Sodium: 137 mmol/L (ref 135–145)
TOTAL PROTEIN: 7.8 g/dL (ref 6.5–8.1)

## 2015-12-17 LAB — CBC
HEMATOCRIT: 39.3 % (ref 36.0–46.0)
Hemoglobin: 13.7 g/dL (ref 12.0–15.0)
MCH: 32.6 pg (ref 26.0–34.0)
MCHC: 34.9 g/dL (ref 30.0–36.0)
MCV: 93.6 fL (ref 78.0–100.0)
PLATELETS: 207 10*3/uL (ref 150–400)
RBC: 4.2 MIL/uL (ref 3.87–5.11)
RDW: 12.2 % (ref 11.5–15.5)
WBC: 5.5 10*3/uL (ref 4.0–10.5)

## 2015-12-17 LAB — URINALYSIS, ROUTINE W REFLEX MICROSCOPIC
BILIRUBIN URINE: NEGATIVE
Glucose, UA: NEGATIVE mg/dL
Hgb urine dipstick: NEGATIVE
Ketones, ur: 20 mg/dL — AB
LEUKOCYTES UA: NEGATIVE
Nitrite: NEGATIVE
PH: 5 (ref 5.0–8.0)
Protein, ur: 30 mg/dL — AB
SPECIFIC GRAVITY, URINE: 1.029 (ref 1.005–1.030)

## 2015-12-17 LAB — I-STAT BETA HCG BLOOD, ED (MC, WL, AP ONLY)

## 2015-12-17 LAB — LIPASE, BLOOD: LIPASE: 31 U/L (ref 11–51)

## 2015-12-17 MED ORDER — SODIUM CHLORIDE 0.9 % IV SOLN: 1000.0000 mL | INTRAVENOUS | Status: DC

## 2015-12-17 MED ORDER — MORPHINE SULFATE (PF) 4 MG/ML IV SOLN
4.0000 mg | Freq: Once | INTRAVENOUS | Status: AC
  Administered 2015-12-17: 4 mg via INTRAVENOUS
  Filled 2015-12-17: qty 1

## 2015-12-17 MED ORDER — SODIUM CHLORIDE 0.9 % IV SOLN
1000.0000 mL | Freq: Once | INTRAVENOUS | Status: AC
  Administered 2015-12-17: 1000 mL via INTRAVENOUS

## 2015-12-17 MED ORDER — ONDANSETRON 8 MG PO TBDP: 8.0000 mg | ORAL_TABLET | Freq: Three times a day (TID) | ORAL | 0 refills | Status: AC | PRN

## 2015-12-17 MED ORDER — ONDANSETRON HCL 4 MG/2ML IJ SOLN
4.0000 mg | Freq: Once | INTRAMUSCULAR | Status: AC
  Administered 2015-12-17: 4 mg via INTRAVENOUS
  Filled 2015-12-17: qty 2

## 2015-12-17 NOTE — ED Notes (Signed)
Pt ambulated to restroom. 

## 2015-12-17 NOTE — ED Triage Notes (Signed)
Patent states history of HIV and HEP B.  Patient states "my hep b levels have been running high".  My stomach has been hurting for 1 week and I have been throwing up and can't hold down anything.

## 2015-12-17 NOTE — ED Provider Notes (Signed)
[facility]-EMERGENCY DEPT Provider Note   CSN: [id number] Arrival date & time: 12/17/15  [id number]     History   Chief Complaint Chief Complaint  Patient presents with  . Abdominal Pain    HPI Sarah Rivers is a 25 y.o. female.  HPI Patient is a 25 year old patient with a history of HIV disease who presents with nausea vomiting.  She also reports some mild left-sided abdominal pain without diarrhea.  She reports decreased oral intake.  Her symptoms are moderate in severity.  No fevers or chills.  Denies dysuria or urinary frequency.  No abnormal vaginal symptoms.   Past Medical History:  Diagnosis Date  . Chronic hepatitis B (HCC)   . Chronic nausea   . HIV disease [facility](HCC)     Patient Active Problem List   Diagnosis Date Noted  . HIV disease (HCC) 11/21/2014  . Chronic hepatitis B (HCC) 11/21/2014  . Chronic nausea 11/21/2014    Past Surgical History:  Procedure Laterality Date  . NO PAST SURGERIES      OB History    Gravida Para Term Preterm AB Living   1       1     SAB TAB Ectopic Multiple Live Births   1               Home Medications    Prior to Admission medications   Medication Sig Start Date End Date Taking? Authorizing Provider  DESCOVY 200-25 MG tablet TAKE 1 TABLET BY MOUTH DAILY. 12/07/15   Judyann Munsonynthia Snider, MD  ondansetron (ZOFRAN ODT) 4 MG disintegrating tablet Take 1 tablet (4 mg total) by mouth every 8 (eight) hours as needed for nausea or vomiting. 04/06/15   Judyann Munsonynthia Snider, MD  TIVICAY 50 MG tablet TAKE 1 TABLET BY MOUTH DAILY. 12/07/15   Judyann Munsonynthia Snider, MD    Family History Family History  Problem Relation Age of Onset  . Hypertension Mother   . Diabetes Mother     Social History Social History  Substance Use Topics  . Smoking status: Never Smoker  . Smokeless tobacco: Never Used  . Alcohol use No     Allergies   Patient has no known allergies.   Review of Systems Review of Systems  All other systems reviewed and are  negative.    Physical Exam Updated Vital Signs BP 140/81 (BP Location: Left Arm)   Pulse 69   Temp 98.2 F (36.8 C) (Oral)   Resp 17   LMP 11/17/2015   SpO2 100%   Physical Exam  Constitutional: She is oriented to person, place, and time. She appears well-developed and well-nourished. No distress.  HENT:  Head: Normocephalic and atraumatic.  Eyes: EOM are normal.  Neck: Normal range of motion.  Cardiovascular: Normal rate, regular rhythm and normal heart sounds.   Pulmonary/Chest: Effort normal and breath sounds normal.  Abdominal: Soft. She exhibits no distension. There is no tenderness.  Musculoskeletal: Normal range of motion.  Neurological: She is alert and oriented to person, place, and time.  Skin: Skin is warm and dry.  Psychiatric: She has a normal mood and affect. Judgment normal.  Nursing note and vitals reviewed.    ED Treatments / Results  Labs (all labs ordered are listed, but only abnormal results are displayed) Labs Reviewed  URINALYSIS, ROUTINE W REFLEX MICROSCOPIC - Abnormal; Notable for the following:       Result Value   APPearance HAZY (*)    Ketones, ur 20 (*)    Protein,  ur 30 (*)    Bacteria, UA RARE (*)    Squamous Epithelial / LPF 6-30 (*)    All other components within normal limits  COMPREHENSIVE METABOLIC PANEL - Abnormal; Notable for the following:    AST 86 (*)    ALT 80 (*)    Total Bilirubin 1.9 (*)    All other components within normal limits  CBC  LIPASE, BLOOD  I-STAT BETA HCG BLOOD, ED ([facility], [facility], [facility] ONLY)    EKG  EKG Interpretation None       Radiology No results found.  Procedures Procedures (including critical care time)  Medications Ordered in ED Medications - No data to display   Initial Impression / Assessment and Plan / ED Course  I have reviewed the triage vital signs and the nursing notes.  Pertinent labs & imaging results that were available during my care of the patient were reviewed by me and  considered in my medical decision making (see chart for details).  Clinical Course     Patient feels much better time of discharge.  Repeat abdominal exam is without tenderness.  She reports no abdominal pain at this time.  Feels better after fluids.  Likely viral nausea vomiting.  Patient understands return to the ER for new or worsening symptoms.  Close primary care follow-up  Final Clinical Impressions(s) / ED Diagnoses   Final diagnoses:  Nausea and vomiting, intractability of vomiting not specified, unspecified vomiting type  Abdominal pain, unspecified abdominal location    New Prescriptions Discharge Medication List as of 12/17/2015  1:43 PM       Azalia BilisKevin Juel Ripley, MD 12/18/15 [phone]

## 2016-02-08 ENCOUNTER — Telehealth: Payer: Self-pay | Admitting: Pharmacy Technician

## 2016-02-08 NOTE — Telephone Encounter (Signed)
Tried calling multiple times.  Cannot reach her about her HIV medication refill pick ups.  She does not want them mailed.

## 2016-02-17 ENCOUNTER — Telehealth: Payer: Self-pay | Admitting: *Deleted

## 2016-02-17 NOTE — Telephone Encounter (Signed)
Referral was received for Sarah Rivers on 09/28/15 with contact made at that time. I was unsuccessful with getting in contact with September.  At this time I have reviewed her chart again and it appears that for the last 7 months she has maintained viral suppression and is in care with Dr Drue SecondSnider. I will close this referral at this time

## 2016-02-23 NOTE — Telephone Encounter (Signed)
HIV meds ready for pick up.Left her a VM to call me.She stated in last call that she had left over meds and would need them starting in August.

## 2016-02-25 ENCOUNTER — Ambulatory Visit: Payer: Managed Care, Other (non HMO) | Admitting: Internal Medicine

## 2016-06-22 IMAGING — US US PELVIS COMPLETE
1 series · 13 of 25 positions shown · non-contrast
Comparison: None.

CLINICAL DATA: Patient with bilateral pelvic pain.

EXAM:
TRANSABDOMINAL AND TRANSVAGINAL ULTRASOUND OF PELVIS
DOPPLER ULTRASOUND OF OVARIES
TECHNIQUE: Both transabdominal and transvaginal ultrasound examinations of the
pelvis were performed. Transabdominal technique was performed for
global imaging of the pelvis including uterus, ovaries, adnexal
regions, and pelvic cul-de-sac.
It was necessary to proceed with endovaginal exam following the
transabdominal exam to visualize the adnexal structures. Color and
duplex Doppler ultrasound was utilized to evaluate blood flow to the
ovaries.

[Series 1: us pelvis complete · 0.20mm/px · 13 of 58 slices shown]
[im 1/58]
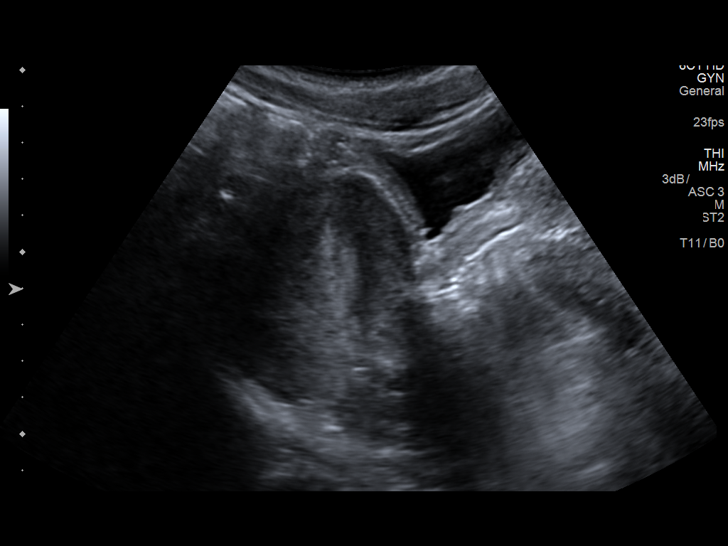
[im 5/58]
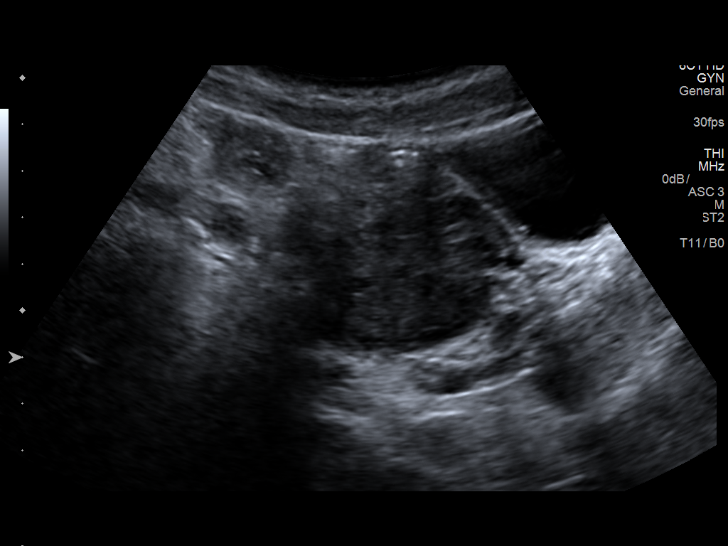
[im 10/58]
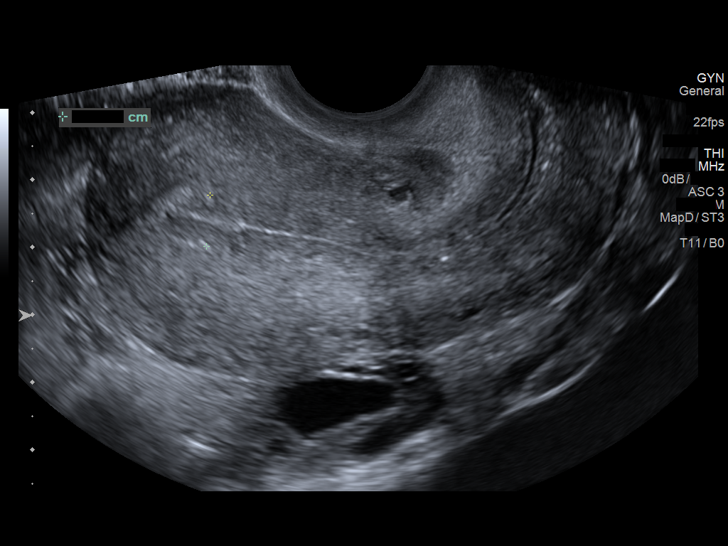
[im 15/58]
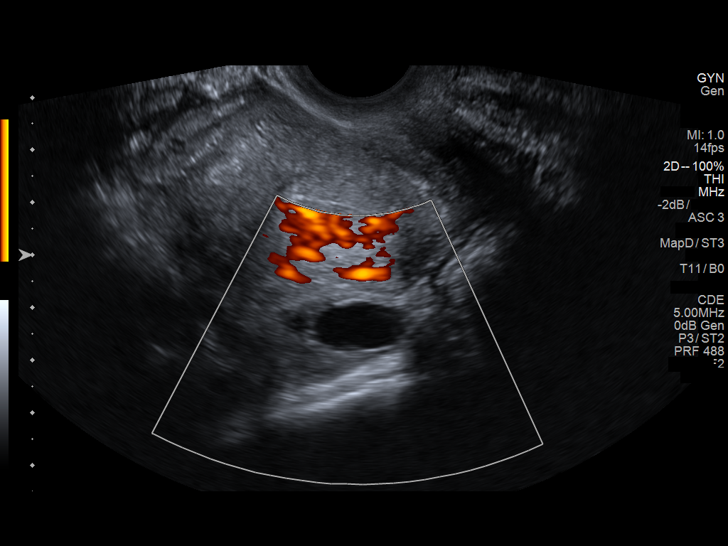
[im 20/58]
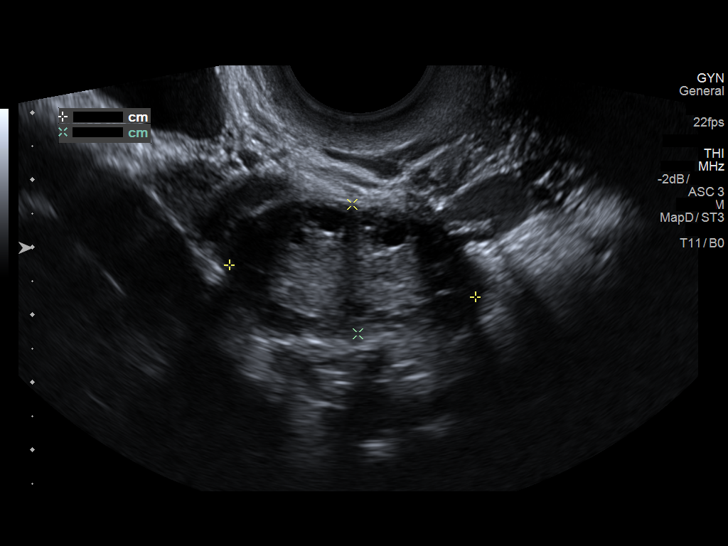
[im 24/58]
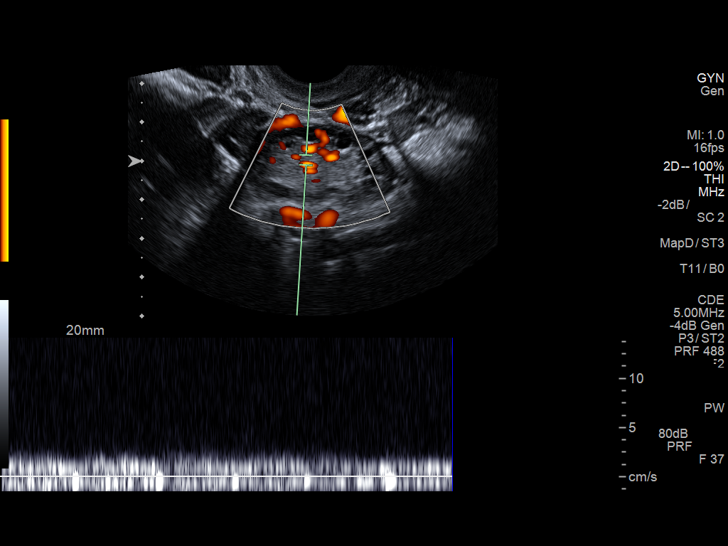
[im 29/58]
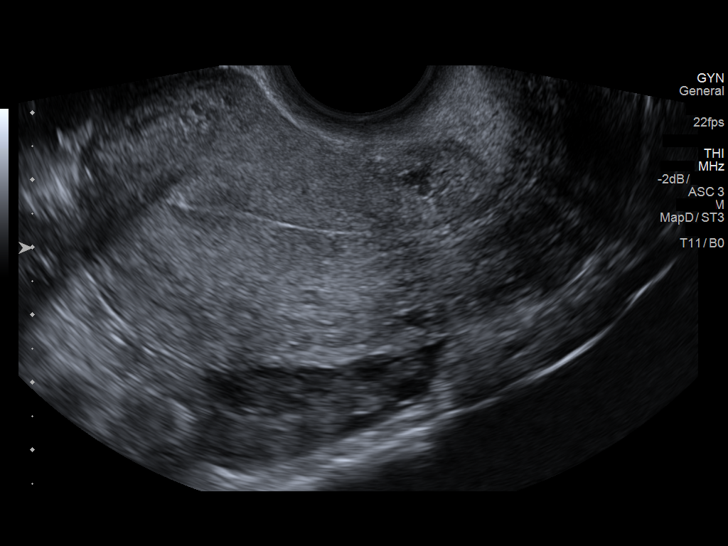
[im 34/58]
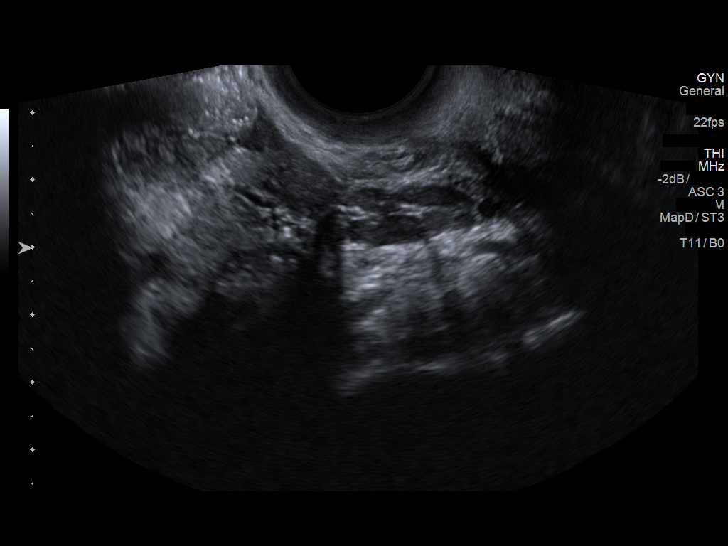
[im 39/58]
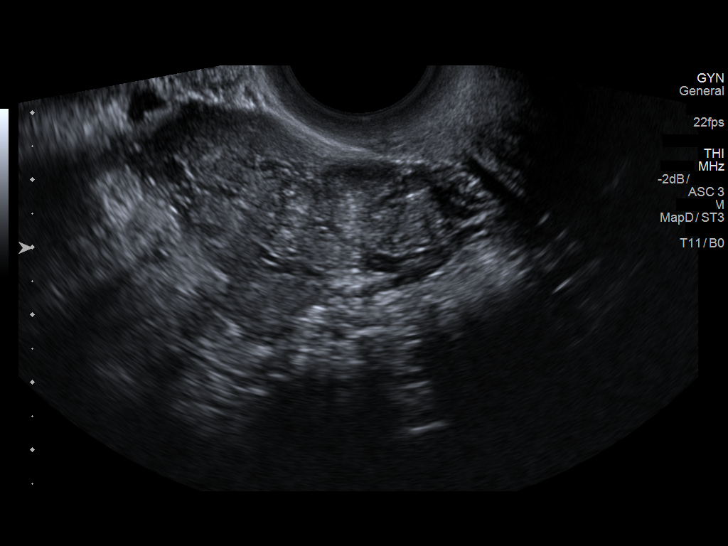
[im 43/58]
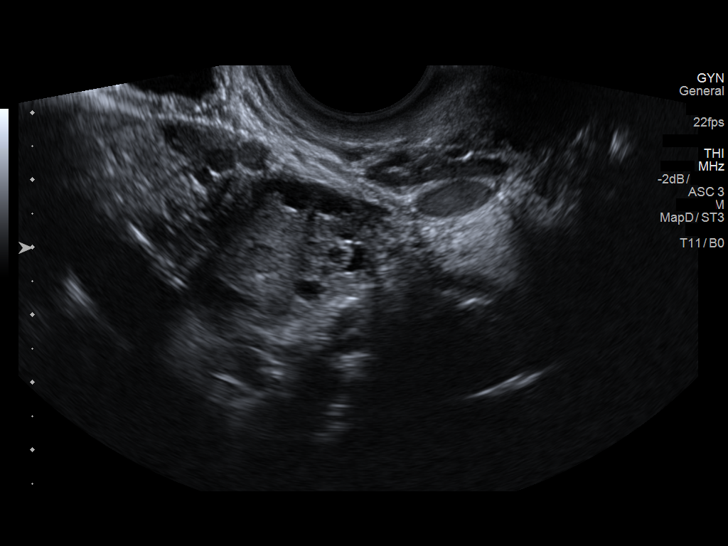
[im 48/58]
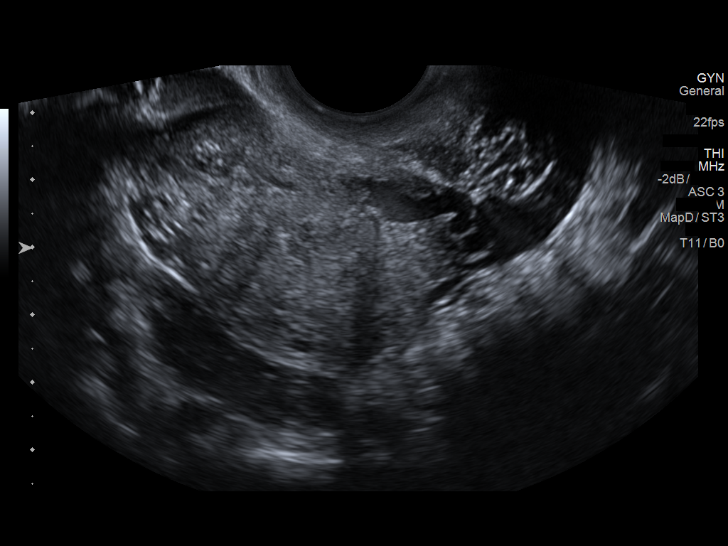
[im 53/58]
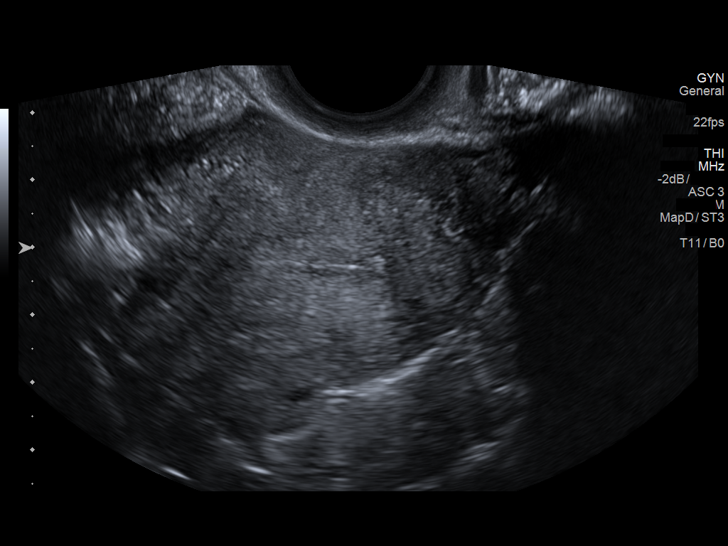
[im 58/58]
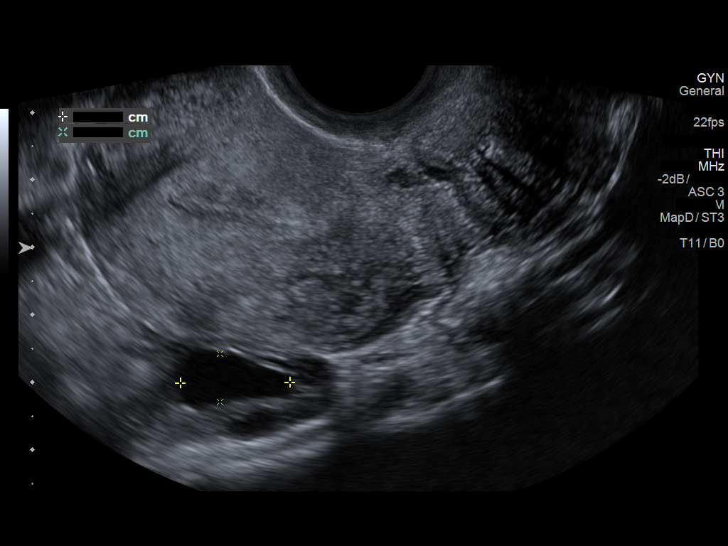

[13 of 25 positions shown; findings below may reference images not displayed]

FINDINGS: Uterus

Measurements: 8.1 x 4.3 x 4.8 cm. No fibroids or other mass
visualized.

Endometrium

Thickness: 7.6 mm.  No focal abnormality visualized.

Right ovary

Measurements: 3.5 x 1.9 x 2.6 cm. Normal appearance/no adnexal mass.

Left ovary

Measurements: 3.7 x 1.9 x 2.1 cm. Normal appearance/no adnexal mass.

Pulsed Doppler evaluation of both ovaries demonstrates normal
low-resistance arterial and venous waveforms.

Other findings

No abnormal free fluid.
IMPRESSION: Unremarkable pelvic ultrasound. No sonographic evidence to suggest
ovarian torsion.
